# Patient Record
Sex: Male | Born: 1950 | Race: White | Hispanic: No | Marital: Married | Smoking: Never smoker
Health system: Southern US, Community
[De-identification: ages and names within clinical notes are randomized; demographics above are authoritative.]

## PROBLEM LIST (undated history)

## (undated) DIAGNOSIS — C801 Malignant (primary) neoplasm, unspecified: Secondary | ICD-10-CM

## (undated) DIAGNOSIS — E119 Type 2 diabetes mellitus without complications: Secondary | ICD-10-CM

## (undated) DIAGNOSIS — R569 Unspecified convulsions: Secondary | ICD-10-CM

## (undated) HISTORY — PX: BRAIN SURGERY: SHX531

## (undated) HISTORY — PX: CARDIAC SURGERY: SHX584

---

## 2014-04-19 ENCOUNTER — Inpatient Hospital Stay (HOSPITAL_COMMUNITY): Payer: Medicare Other

## 2014-04-19 ENCOUNTER — Encounter (HOSPITAL_COMMUNITY): Payer: Self-pay

## 2014-04-19 ENCOUNTER — Inpatient Hospital Stay (HOSPITAL_COMMUNITY)
Admission: EM | Admit: 2014-04-19 | Discharge: 2014-04-27 | DRG: 871 | Disposition: E | Payer: Medicare Other | Attending: Internal Medicine | Admitting: Internal Medicine

## 2014-04-19 ENCOUNTER — Inpatient Hospital Stay
Admission: EM | Admit: 2014-04-19 | Payer: Medicare Other | Source: Other Acute Inpatient Hospital | Admitting: Internal Medicine

## 2014-04-19 ENCOUNTER — Emergency Department (HOSPITAL_COMMUNITY): Payer: Medicare Other

## 2014-04-19 DIAGNOSIS — J15211 Pneumonia due to Methicillin susceptible Staphylococcus aureus: Secondary | ICD-10-CM | POA: Diagnosis present

## 2014-04-19 DIAGNOSIS — I5021 Acute systolic (congestive) heart failure: Secondary | ICD-10-CM | POA: Diagnosis present

## 2014-04-19 DIAGNOSIS — E1165 Type 2 diabetes mellitus with hyperglycemia: Secondary | ICD-10-CM | POA: Diagnosis present

## 2014-04-19 DIAGNOSIS — Z22322 Carrier or suspected carrier of Methicillin resistant Staphylococcus aureus: Secondary | ICD-10-CM | POA: Diagnosis not present

## 2014-04-19 DIAGNOSIS — I2510 Atherosclerotic heart disease of native coronary artery without angina pectoris: Secondary | ICD-10-CM | POA: Diagnosis present

## 2014-04-19 DIAGNOSIS — G8191 Hemiplegia, unspecified affecting right dominant side: Secondary | ICD-10-CM | POA: Diagnosis present

## 2014-04-19 DIAGNOSIS — E1129 Type 2 diabetes mellitus with other diabetic kidney complication: Secondary | ICD-10-CM | POA: Diagnosis present

## 2014-04-19 DIAGNOSIS — Z66 Do not resuscitate: Secondary | ICD-10-CM | POA: Diagnosis not present

## 2014-04-19 DIAGNOSIS — Z978 Presence of other specified devices: Secondary | ICD-10-CM | POA: Insufficient documentation

## 2014-04-19 DIAGNOSIS — G9340 Encephalopathy, unspecified: Secondary | ICD-10-CM | POA: Diagnosis present

## 2014-04-19 DIAGNOSIS — Z79899 Other long term (current) drug therapy: Secondary | ICD-10-CM | POA: Diagnosis not present

## 2014-04-19 DIAGNOSIS — Z515 Encounter for palliative care: Secondary | ICD-10-CM

## 2014-04-19 DIAGNOSIS — E43 Unspecified severe protein-calorie malnutrition: Secondary | ICD-10-CM | POA: Diagnosis present

## 2014-04-19 DIAGNOSIS — Z87891 Personal history of nicotine dependence: Secondary | ICD-10-CM

## 2014-04-19 DIAGNOSIS — Z794 Long term (current) use of insulin: Secondary | ICD-10-CM

## 2014-04-19 DIAGNOSIS — R791 Abnormal coagulation profile: Secondary | ICD-10-CM | POA: Diagnosis present

## 2014-04-19 DIAGNOSIS — R6520 Severe sepsis without septic shock: Secondary | ICD-10-CM | POA: Diagnosis present

## 2014-04-19 DIAGNOSIS — E785 Hyperlipidemia, unspecified: Secondary | ICD-10-CM | POA: Diagnosis present

## 2014-04-19 DIAGNOSIS — Z789 Other specified health status: Secondary | ICD-10-CM

## 2014-04-19 DIAGNOSIS — Z7902 Long term (current) use of antithrombotics/antiplatelets: Secondary | ICD-10-CM

## 2014-04-19 DIAGNOSIS — R4182 Altered mental status, unspecified: Secondary | ICD-10-CM

## 2014-04-19 DIAGNOSIS — A419 Sepsis, unspecified organism: Secondary | ICD-10-CM

## 2014-04-19 DIAGNOSIS — I63519 Cerebral infarction due to unspecified occlusion or stenosis of unspecified middle cerebral artery: Secondary | ICD-10-CM | POA: Diagnosis present

## 2014-04-19 DIAGNOSIS — E440 Moderate protein-calorie malnutrition: Secondary | ICD-10-CM

## 2014-04-19 DIAGNOSIS — A4101 Sepsis due to Methicillin susceptible Staphylococcus aureus: Secondary | ICD-10-CM | POA: Diagnosis present

## 2014-04-19 DIAGNOSIS — M6289 Other specified disorders of muscle: Secondary | ICD-10-CM

## 2014-04-19 DIAGNOSIS — Z7982 Long term (current) use of aspirin: Secondary | ICD-10-CM

## 2014-04-19 DIAGNOSIS — J13 Pneumonia due to Streptococcus pneumoniae: Secondary | ICD-10-CM | POA: Diagnosis present

## 2014-04-19 DIAGNOSIS — Z85841 Personal history of malignant neoplasm of brain: Secondary | ICD-10-CM

## 2014-04-19 DIAGNOSIS — D689 Coagulation defect, unspecified: Secondary | ICD-10-CM

## 2014-04-19 DIAGNOSIS — R40243 Glasgow coma scale score 3-8: Secondary | ICD-10-CM | POA: Diagnosis not present

## 2014-04-19 DIAGNOSIS — K8020 Calculus of gallbladder without cholecystitis without obstruction: Secondary | ICD-10-CM | POA: Diagnosis present

## 2014-04-19 DIAGNOSIS — J9601 Acute respiratory failure with hypoxia: Secondary | ICD-10-CM | POA: Diagnosis present

## 2014-04-19 DIAGNOSIS — Z931 Gastrostomy status: Secondary | ICD-10-CM

## 2014-04-19 DIAGNOSIS — R531 Weakness: Secondary | ICD-10-CM | POA: Insufficient documentation

## 2014-04-19 DIAGNOSIS — I10 Essential (primary) hypertension: Secondary | ICD-10-CM | POA: Diagnosis present

## 2014-04-19 DIAGNOSIS — Z89422 Acquired absence of other left toe(s): Secondary | ICD-10-CM | POA: Diagnosis not present

## 2014-04-19 DIAGNOSIS — R410 Disorientation, unspecified: Secondary | ICD-10-CM

## 2014-04-19 HISTORY — DX: Malignant (primary) neoplasm, unspecified: C80.1

## 2014-04-19 HISTORY — DX: Unspecified convulsions: R56.9

## 2014-04-19 HISTORY — DX: Type 2 diabetes mellitus without complications: E11.9

## 2014-04-19 LAB — MRSA PCR SCREENING: MRSA by PCR: POSITIVE — AB

## 2014-04-19 LAB — I-STAT ARTERIAL BLOOD GAS, ED
Acid-base deficit: 4 mmol/L — ABNORMAL HIGH (ref 0.0–2.0)
BICARBONATE: 19.6 meq/L — AB (ref 20.0–24.0)
O2 Saturation: 99 %
Patient temperature: 98.1
TCO2: 21 mmol/L (ref 0–100)
pCO2 arterial: 30.5 mmHg — ABNORMAL LOW (ref 35.0–45.0)
pH, Arterial: 7.415 (ref 7.350–7.450)
pO2, Arterial: 121 mmHg — ABNORMAL HIGH (ref 80.0–100.0)

## 2014-04-19 LAB — URINALYSIS, ROUTINE W REFLEX MICROSCOPIC
KETONES UR: 15 mg/dL — AB
LEUKOCYTES UA: NEGATIVE
Nitrite: NEGATIVE
Protein, ur: >300 mg/dL — AB
Urobilinogen, UA: 1 mg/dL (ref 0.0–1.0)
pH: 6 (ref 5.0–8.0)

## 2014-04-19 LAB — CORTISOL: Cortisol, Plasma: 86.8 ug/dL

## 2014-04-19 LAB — POCT I-STAT 3, ART BLOOD GAS (G3+)
ACID-BASE DEFICIT: 2 mmol/L (ref 0.0–2.0)
Bicarbonate: 21 mEq/L (ref 20.0–24.0)
O2 SAT: 95 %
PH ART: 7.47 — AB (ref 7.350–7.450)
PO2 ART: 65 mmHg — AB (ref 80.0–100.0)
TCO2: 22 mmol/L (ref 0–100)
pCO2 arterial: 28.6 mmHg — ABNORMAL LOW (ref 35.0–45.0)

## 2014-04-19 LAB — CBC WITH DIFFERENTIAL/PLATELET
Basophils Absolute: 0 10*3/uL (ref 0.0–0.1)
Basophils Relative: 0 % (ref 0–1)
Eosinophils Absolute: 0 10*3/uL (ref 0.0–0.7)
Eosinophils Relative: 0 % (ref 0–5)
HEMATOCRIT: 42.5 % (ref 39.0–52.0)
Hemoglobin: 15.3 g/dL (ref 13.0–17.0)
LYMPHS PCT: 3 % — AB (ref 12–46)
Lymphs Abs: 0.6 10*3/uL — ABNORMAL LOW (ref 0.7–4.0)
MCH: 32.4 pg (ref 26.0–34.0)
MCHC: 36 g/dL (ref 30.0–36.0)
MCV: 90 fL (ref 78.0–100.0)
MONOS PCT: 7 % (ref 3–12)
Monocytes Absolute: 1.3 10*3/uL — ABNORMAL HIGH (ref 0.1–1.0)
Neutro Abs: 16.7 10*3/uL — ABNORMAL HIGH (ref 1.7–7.7)
Neutrophils Relative %: 90 % — ABNORMAL HIGH (ref 43–77)
Platelets: 210 10*3/uL (ref 150–400)
RBC: 4.72 MIL/uL (ref 4.22–5.81)
RDW: 12.2 % (ref 11.5–15.5)
WBC: 18.5 10*3/uL — AB (ref 4.0–10.5)

## 2014-04-19 LAB — GLUCOSE, CAPILLARY
GLUCOSE-CAPILLARY: 186 mg/dL — AB (ref 70–99)
GLUCOSE-CAPILLARY: 117 mg/dL — AB (ref 70–99)
Glucose-Capillary: 381 mg/dL — ABNORMAL HIGH (ref 70–99)
Glucose-Capillary: 459 mg/dL — ABNORMAL HIGH (ref 70–99)
Glucose-Capillary: 485 mg/dL — ABNORMAL HIGH (ref 70–99)
Glucose-Capillary: 443 mg/dL — ABNORMAL HIGH (ref 70–99)
Glucose-Capillary: 325 mg/dL — ABNORMAL HIGH (ref 70–99)
Glucose-Capillary: 271 mg/dL — ABNORMAL HIGH (ref 70–99)
Glucose-Capillary: 229 mg/dL — ABNORMAL HIGH (ref 70–99)

## 2014-04-19 LAB — COMPREHENSIVE METABOLIC PANEL
ALT: 13 U/L (ref 0–53)
ANION GAP: 12 (ref 5–15)
AST: 16 U/L (ref 0–37)
Albumin: 2.8 g/dL — ABNORMAL LOW (ref 3.5–5.2)
Alkaline Phosphatase: 163 U/L — ABNORMAL HIGH (ref 39–117)
BUN: 17 mg/dL (ref 6–23)
CHLORIDE: 103 mmol/L (ref 96–112)
CO2: 20 mmol/L (ref 19–32)
Calcium: 8.9 mg/dL (ref 8.4–10.5)
Creatinine, Ser: 1.08 mg/dL (ref 0.50–1.35)
GFR calc Af Amer: 81 mL/min — ABNORMAL LOW (ref >90)
GFR, EST NON AFRICAN AMERICAN: 70 mL/min — AB (ref >90)
Glucose, Bld: 420 mg/dL — ABNORMAL HIGH (ref 70–99)
POTASSIUM: 4.5 mmol/L (ref 3.5–5.1)
SODIUM: 135 mmol/L (ref 135–145)
Total Bilirubin: 0.9 mg/dL (ref 0.3–1.2)
Total Protein: 7.3 g/dL (ref 6.0–8.3)

## 2014-04-19 LAB — TROPONIN I
TROPONIN I: 0.14 ng/mL — AB (ref <0.031)
Troponin I: 0.35 ng/mL — ABNORMAL HIGH (ref <0.031)
Troponin I: 0.07 ng/mL — ABNORMAL HIGH (ref <0.031)

## 2014-04-19 LAB — I-STAT CG4 LACTIC ACID, ED: LACTIC ACID, VENOUS: 2.77 mmol/L — AB (ref 0.5–2.0)

## 2014-04-19 LAB — URINE MICROSCOPIC-ADD ON

## 2014-04-19 LAB — PROTIME-INR
INR: 1.63 — AB (ref 0.00–1.49)
PROTHROMBIN TIME: 19.5 s — AB (ref 11.6–15.2)

## 2014-04-19 LAB — PHOSPHORUS: PHOSPHORUS: 1.3 mg/dL — AB (ref 2.3–4.6)

## 2014-04-19 LAB — BRAIN NATRIURETIC PEPTIDE: B NATRIURETIC PEPTIDE 5: 164.2 pg/mL — AB (ref 0.0–100.0)

## 2014-04-19 LAB — PHENYTOIN LEVEL, TOTAL: Phenytoin Lvl: 14.2 ug/mL (ref 10.0–20.0)

## 2014-04-19 LAB — CBG MONITORING, ED: Glucose-Capillary: 413 mg/dL — ABNORMAL HIGH (ref 70–99)

## 2014-04-19 LAB — ABO/RH: ABO/RH(D): O POS

## 2014-04-19 LAB — TYPE AND SCREEN
ABO/RH(D): O POS
Antibody Screen: NEGATIVE

## 2014-04-19 LAB — MAGNESIUM: Magnesium: 1.8 mg/dL (ref 1.5–2.5)

## 2014-04-19 LAB — LACTIC ACID, PLASMA: Lactic Acid, Venous: 2 mmol/L (ref 0.5–2.0)

## 2014-04-19 LAB — APTT: APTT: 33 s (ref 24–37)

## 2014-04-19 LAB — FIBRINOGEN: Fibrinogen: >800 mg/dL — ABNORMAL HIGH (ref 204–475)

## 2014-04-19 MED ORDER — VITAL HIGH PROTEIN PO LIQD
1000.0000 mL | ORAL | Status: DC
  Filled 2014-04-19 (×2): qty 1000

## 2014-04-19 MED ORDER — PROPOFOL 10 MG/ML IV EMUL
INTRAVENOUS | Status: AC
  Administered 2014-04-19: 04:00:00
  Filled 2014-04-19: qty 100

## 2014-04-19 MED ORDER — DEXTROSE 5 % IV SOLN
2.0000 g | Freq: Three times a day (TID) | INTRAVENOUS | Status: DC
  Administered 2014-04-19 – 2014-04-20 (×4): 2 g via INTRAVENOUS
  Filled 2014-04-19 (×7): qty 2

## 2014-04-19 MED ORDER — INSULIN ASPART 100 UNIT/ML ~~LOC~~ SOLN: 0.0000 [IU] | Freq: Every day | SUBCUTANEOUS | Status: DC

## 2014-04-19 MED ORDER — CHLORHEXIDINE GLUCONATE 0.12 % MT SOLN
15.0000 mL | Freq: Two times a day (BID) | OROMUCOSAL | Status: DC
  Administered 2014-04-19 – 2014-04-21 (×5): 15 mL via OROMUCOSAL
  Filled 2014-04-19 (×5): qty 15

## 2014-04-19 MED ORDER — PHENYTOIN 125 MG/5ML PO SUSP
300.0000 mg | Freq: Every day | ORAL | Status: DC
  Filled 2014-04-19: qty 12

## 2014-04-19 MED ORDER — ASPIRIN EC 81 MG PO TBEC
81.0000 mg | DELAYED_RELEASE_TABLET | Freq: Every day | ORAL | Status: DC
  Filled 2014-04-19: qty 1

## 2014-04-19 MED ORDER — SODIUM CHLORIDE 0.9 % IV SOLN
INTRAVENOUS | Status: DC
  Administered 2014-04-19: 3.8 [IU]/h via INTRAVENOUS
  Administered 2014-04-21: 14 [IU]/h via INTRAVENOUS
  Filled 2014-04-19 (×2): qty 2.5

## 2014-04-19 MED ORDER — FENTANYL CITRATE 0.05 MG/ML IJ SOLN
50.0000 ug | INTRAMUSCULAR | Status: AC | PRN
Start: 2014-04-19 — End: 2014-04-19
  Administered 2014-04-19 (×3): 50 ug via INTRAVENOUS
  Filled 2014-04-19 (×2): qty 2

## 2014-04-19 MED ORDER — ETOMIDATE 2 MG/ML IV SOLN
20.0000 mg | Freq: Once | INTRAVENOUS | Status: AC
  Administered 2014-04-19: 20 mg via INTRAVENOUS

## 2014-04-19 MED ORDER — PANTOPRAZOLE SODIUM 40 MG IV SOLR
40.0000 mg | INTRAVENOUS | Status: DC
  Administered 2014-04-19 – 2014-04-21 (×3): 40 mg via INTRAVENOUS
  Filled 2014-04-19 (×4): qty 40

## 2014-04-19 MED ORDER — SODIUM CHLORIDE 0.9 % IV BOLUS (SEPSIS)
1000.0000 mL | Freq: Once | INTRAVENOUS | Status: AC
  Administered 2014-04-19: 1000 mL via INTRAVENOUS

## 2014-04-19 MED ORDER — HEPARIN SODIUM (PORCINE) 5000 UNIT/ML IJ SOLN
5000.0000 [IU] | Freq: Three times a day (TID) | INTRAMUSCULAR | Status: DC
  Administered 2014-04-19 – 2014-04-21 (×7): 5000 [IU] via SUBCUTANEOUS
  Filled 2014-04-19 (×6): qty 1

## 2014-04-19 MED ORDER — SUCCINYLCHOLINE CHLORIDE 20 MG/ML IJ SOLN
100.0000 mg | Freq: Once | INTRAMUSCULAR | Status: AC
  Administered 2014-04-19: 100 mg via INTRAVENOUS

## 2014-04-19 MED ORDER — GABAPENTIN 400 MG PO CAPS
800.0000 mg | ORAL_CAPSULE | Freq: Three times a day (TID) | ORAL | Status: DC
  Administered 2014-04-19 (×3): 800 mg via ORAL
  Filled 2014-04-19 (×6): qty 2

## 2014-04-19 MED ORDER — MUPIROCIN 2 % EX OINT
1.0000 "application " | TOPICAL_OINTMENT | Freq: Two times a day (BID) | CUTANEOUS | Status: DC
  Administered 2014-04-19 – 2014-04-21 (×5): 1 via NASAL
  Filled 2014-04-19: qty 22

## 2014-04-19 MED ORDER — ASPIRIN 81 MG PO CHEW
81.0000 mg | CHEWABLE_TABLET | Freq: Every day | ORAL | Status: DC
  Administered 2014-04-19 – 2014-04-21 (×3): 81 mg via ORAL
  Filled 2014-04-19 (×3): qty 1

## 2014-04-19 MED ORDER — POTASSIUM & SODIUM PHOSPHATES 280-160-250 MG PO PACK
2.0000 | PACK | Freq: Three times a day (TID) | ORAL | Status: AC
  Administered 2014-04-19 – 2014-04-20 (×3): 2
  Filled 2014-04-19 (×3): qty 2

## 2014-04-19 MED ORDER — DOCUSATE SODIUM 50 MG/5ML PO LIQD
100.0000 mg | Freq: Two times a day (BID) | ORAL | Status: DC | PRN
  Filled 2014-04-19: qty 10

## 2014-04-19 MED ORDER — GABAPENTIN 800 MG PO TABS
800.0000 mg | ORAL_TABLET | Freq: Three times a day (TID) | ORAL | Status: DC
  Filled 2014-04-19 (×3): qty 1

## 2014-04-19 MED ORDER — PRO-STAT SUGAR FREE PO LIQD
30.0000 mL | Freq: Two times a day (BID) | ORAL | Status: AC
  Administered 2014-04-19 (×2): 30 mL
  Filled 2014-04-19 (×2): qty 30

## 2014-04-19 MED ORDER — FENTANYL CITRATE 0.05 MG/ML IJ SOLN
50.0000 ug | INTRAMUSCULAR | Status: DC | PRN
  Administered 2014-04-19 – 2014-04-21 (×4): 50 ug via INTRAVENOUS
  Filled 2014-04-19 (×5): qty 2

## 2014-04-19 MED ORDER — VITAL AF 1.2 CAL PO LIQD
1000.0000 mL | ORAL | Status: DC
  Administered 2014-04-19 – 2014-04-21 (×3): 1000 mL
  Filled 2014-04-19 (×8): qty 1000

## 2014-04-19 MED ORDER — VANCOMYCIN HCL IN DEXTROSE 1-5 GM/200ML-% IV SOLN
1000.0000 mg | Freq: Two times a day (BID) | INTRAVENOUS | Status: DC
  Administered 2014-04-19 – 2014-04-20 (×4): 1000 mg via INTRAVENOUS
  Filled 2014-04-19 (×7): qty 200

## 2014-04-19 MED ORDER — PHENYTOIN 125 MG/5ML PO SUSP
200.0000 mg | Freq: Every day | ORAL | Status: DC
  Administered 2014-04-19 – 2014-04-20 (×2): 200 mg
  Filled 2014-04-19 (×3): qty 8

## 2014-04-19 MED ORDER — SODIUM CHLORIDE 0.9 % IV SOLN
250.0000 mL | INTRAVENOUS | Status: DC | PRN
  Administered 2014-04-21: 250 mL via INTRAVENOUS

## 2014-04-19 MED ORDER — CETYLPYRIDINIUM CHLORIDE 0.05 % MT LIQD
7.0000 mL | Freq: Four times a day (QID) | OROMUCOSAL | Status: DC
  Administered 2014-04-19 – 2014-04-21 (×9): 7 mL via OROMUCOSAL

## 2014-04-19 MED ORDER — CLOPIDOGREL BISULFATE 75 MG PO TABS
75.0000 mg | ORAL_TABLET | Freq: Every day | ORAL | Status: DC
  Administered 2014-04-19 – 2014-04-21 (×3): 75 mg via ORAL
  Filled 2014-04-19 (×3): qty 1

## 2014-04-19 MED ORDER — PHENYTOIN 125 MG/5ML PO SUSP
100.0000 mg | Freq: Every morning | ORAL | Status: DC
  Administered 2014-04-19 – 2014-04-21 (×3): 100 mg
  Filled 2014-04-19 (×3): qty 4

## 2014-04-19 MED ORDER — DEXTROSE 5 % IV SOLN
500.0000 mg | INTRAVENOUS | Status: DC
  Administered 2014-04-19 – 2014-04-20 (×2): 500 mg via INTRAVENOUS
  Filled 2014-04-19 (×2): qty 500

## 2014-04-19 MED ORDER — LEVETIRACETAM 100 MG/ML PO SOLN
1000.0000 mg | Freq: Two times a day (BID) | ORAL | Status: DC
Start: 2014-04-19 — End: 2014-04-21
  Administered 2014-04-19 – 2014-04-21 (×5): 1000 mg
  Filled 2014-04-19 (×6): qty 10

## 2014-04-19 MED ORDER — INSULIN ASPART 100 UNIT/ML ~~LOC~~ SOLN
0.0000 [IU] | SUBCUTANEOUS | Status: DC
  Administered 2014-04-19: 15 [IU] via SUBCUTANEOUS

## 2014-04-19 MED ORDER — INSULIN ASPART 100 UNIT/ML ~~LOC~~ SOLN: 0.0000 [IU] | Freq: Three times a day (TID) | SUBCUTANEOUS | Status: DC

## 2014-04-19 MED ORDER — CEFEPIME HCL 2 G IJ SOLR: 2.0000 g | Freq: Once | INTRAMUSCULAR | Status: DC

## 2014-04-19 MED ORDER — VANCOMYCIN HCL IN DEXTROSE 1-5 GM/200ML-% IV SOLN
1000.0000 mg | Freq: Once | INTRAVENOUS | Status: DC
Start: 2014-04-19 — End: 2014-04-19
  Administered 2014-04-19: 1000 mg via INTRAVENOUS

## 2014-04-19 MED ORDER — CHLORHEXIDINE GLUCONATE CLOTH 2 % EX PADS
6.0000 | MEDICATED_PAD | Freq: Every day | CUTANEOUS | Status: DC
  Administered 2014-04-20 – 2014-04-22 (×3): 6 via TOPICAL

## 2014-04-19 NOTE — Progress Notes (Addendum)
PULMONARY / CRITICAL CARE MEDICINE HISTORY AND PHYSICAL EXAMINATION   Name: Dylan Rogers MRN: [id number] DOB: 03/17/1950    ADMISSION DATE:  04/14/2014  PRIMARY SERVICE: PCCM  CHIEF COMPLAINT:  Altered Mental status  BRIEF PATIENT DESCRIPTION: 64 y/o man with recent craniotomy for tx of astrocytoma who presents w/ fever, tachypnea, and AMS  SIGNIFICANT EVENTS / STUDIES:  Intubated in ED for respiratory distress  LINES / TUBES: ETT PIV x2 OGT Foley  CULTURES: Blood and urine cx obtained from OSH [facility])  ANTIBIOTICS: Cefepime Vanc Azithro  SUBJECTIVE:  Patient not alert Tmax of 101.7  VITAL SIGNS: Temp:  [98.1 F (36.7 C)-101.7 F (38.7 C)] 98.6 F (37 C) (02/22 0615) Pulse Rate:  [121-141] 123 (02/22 0645) Resp:  [21-35] 22 (02/22 0645) BP: (98-168)/(58-83) 148/80 mmHg (02/22 0645) SpO2:  [93 %-100 %] 99 % (02/22 0645) FiO2 (%):  [40 %] 40 % (02/22 0615) Weight:  [160 lb 15 oz (73 kg)] 160 lb 15 oz (73 kg) (02/22 0423) HEMODYNAMICS:   VENTILATOR SETTINGS: Vent Mode:  [-] PRVC FiO2 (%):  [40 %] 40 % Set Rate:  [18 bmp-22 bmp] 22 bmp Vt Set:  [500 mL-650 mL] 500 mL PEEP:  [5 cmH20] 5 cmH20 Plateau Pressure:  [11 cmH20-12 cmH20] 12 cmH20 INTAKE / OUTPUT: Intake/Output    None     PHYSICAL EXAMINATION: General:  Chronically ill appearing, intubated Neuro:  Non-sedated but grimaces to painful stimulation of right side with some withdrawal. Does not appear to respond to left side HEENT:  Evidence of prior craniotomy. Neck: No JVD Cardiovascular:  Tachycardic, no M/R/G Lungs:  Clear to auscultation on anterior auscultation Abdomen:  Soft Musculoskeletal:  S/p left transmetatarsal amputation Skin:  Intact  LABS:  CBC  Recent Labs Lab 04/11/2014 0421  WBC 18.5*  HGB 15.3  HCT 42.5  PLT 210   Coag's  Recent Labs Lab 04/22/2014 0425  APTT 33  INR 1.63*   BMET  Recent Labs Lab 04/13/2014 0421  NA 135  K 4.5  CL 103  CO2 20  BUN 17   CREATININE 1.08  GLUCOSE 420*   Electrolytes  Recent Labs Lab 04/12/2014 0421  CALCIUM 8.9   Sepsis Markers  Recent Labs Lab 04/21/2014 0430  LATICACIDVEN 2.77*   ABG  Recent Labs Lab 04/16/2014 0505  PHART 7.415  PCO2ART 30.5*  PO2ART 121.0*   Liver Enzymes  Recent Labs Lab 04/18/2014 0421  AST 16  ALT 13  ALKPHOS 163*  BILITOT 0.9  ALBUMIN 2.8*   Cardiac Enzymes  Recent Labs Lab 03/29/2014 0425  TROPONINI 0.35*   Glucose  Recent Labs Lab 04/13/2014 0413 04/20/2014 0620  GLUCAP 413* 381*    Imaging Dg Chest Portable 1 View  04/05/2014   CLINICAL DATA:  Respiratory distress, code sepsis, intubated.  EXAM: PORTABLE CHEST - 1 VIEW  COMPARISON:  None.  FINDINGS: Endotracheal tube is 10 cm from the carina at the thoracic inlet. Enteric tube is in place, tip below the diaphragm, not included in the field of view. Patient is post median sternotomy. The heart size and mediastinal contours are normal. Pulmonary vasculature is normal. There is ill-defined opacity in the right lung base. No large pleural effusion or pneumothorax. No acute osseous abnormality is seen.  IMPRESSION: 1. Endotracheal tube 10 cm from the carina at the thoracic inlet. Enteric tube in place, tip not included in the field of view. 2. Ill-defined opacity at the right lung base, likely atelectasis. Pneumonia could have a  similar appearance.   Electronically Signed   By: Jeb Levering M.D.   On: 04/09/2014 04:55   Dg Abd Portable 1v  04/12/2014   CLINICAL DATA:  Nasogastric catheter repositioning  EXAM: PORTABLE ABDOMEN - 1 VIEW  COMPARISON:  Film from earlier in the same day  FINDINGS: The nasogastric catheter is been withdrawn slightly and on coiled within the stomach. It lies in the proximal portion of the stomach. Scattered large and small bowel gas is noted without obstructive pattern. No other focal abnormality is seen.  IMPRESSION: Nasogastric catheter within the stomach proximally. The previously  seen loop has been reduced.   Electronically Signed   By: Inez Catalina M.D.   On: 03/30/2014 08:30   Dg Abd Portable 1v  03/29/2014   CLINICAL DATA:  NG tube placement.  EXAM: PORTABLE ABDOMEN - 1 VIEW  COMPARISON:  Chest radiograph -earlier same day  FINDINGS: Enteric tube tip and side port projects over the gastric fundus.  Paucity of bowel gas without definite evidence of obstruction.  No supine evidence of pneumoperitoneum, though note, the right upper abdominal quadrant is excluded from view.  Limited visualization of lower thorax demonstrates sequela of prior median sternotomy and potential minimal right basilar opacities, incompletely evaluated.  No acute osseus abnormalities.  IMPRESSION: Enteric tube tip and side port projects over the gastric fundus.   Electronically Signed   By: Sandi Mariscal M.D.   On: 04/08/2014 08:13    EKG: Sinus tachycardia CXR: RLL infiltrate consistent with PNA, tube high ( will adjust down)  ASSESSMENT / PLAN:  Active Problems:   Sepsis   PULMONARY A: Hypoxia Respiratory Failure requiring mechanical ventilation Pneumonia P:   Unsure why on 6 cc/kg, as rate 22, can adjust UP tv to 7 cc/kg and rate down, repeat abg Keep same MV Wean with no intention to extubate today pcxr in am for worsening infiltrate rt base? Aspiration more likley than CAP  CARDIOVASCULAR A: Sinus tachycarida Hx of CAD on plavix P:   Unclear if patient has had PCI requiring stents and when these were placed. Continue plavix for now. Tele Pos balance , la repeated  RENAL A: Severe sepsis P:   Pos balance LA repeat ensure got 30 cc/kg bolus on admission Chem in am   GASTROINTESTINAL A: Need for enteral feeds Probable malnutrition P:   Thin habitus and low albumin suggestive of poor nutritional status. Will consult RD for tube feed recs Start TF, pepup  HEMATOLOGIC A: Apparent need for antiplatelet therapy Leukocytosis coagulapthy ( cause unknown) - sepsis,  liver>>? P:   Repeat coags in am  scd Treat sepsis If bleeding then reverse LF repeat in am   INFECTIOUS A: Severe sepsis due to Pneumonia Reminds me of aspiration P:   2/22 cefepime>>> 2/22 azithro>>> 2/22 vanc>>>  BC 2/22>>> Sputum 2/22>>> In am narrow  ENDOCRINE A: DM, insulin dependent P: Will manage with ICU insulin protocol Hold metformin  NEUROLOGIC A: Encephalopathy ?Seizure disorder R/o new CVA (weakness), r/o astrocytoma progression P:   Appreciate neuro recs; favor acute process driven by sepsis, in the setting of poor reserve with hx of astrocytoma and prior surgery. Keppra 1000mg  BID MRI eeg If MRI neg, consider LP  TODAY'S SUMMARY: 64 y/o man with sepsis and altered mental status with respiratory failure requiring mechanical ventilation.  Cordelia Poche, MD PGY-2, [facility] 04/15/2014, 7:06 AM   STAFF NOTE: Linwood Dibbles, MD FACP have personally reviewed patient's available data, including  medical history, events of note, physical examination and test results as part of my evaluation. I have discussed with resident/NP and other care providers such as pharmacist, RN and RRT. In addition, I personally evaluated patient and elicited key findings of: poor neuro assessment left weakness, for eeg, MRI, wean, adjust TV up, rate down to keep same MV, ABX for now, likley to narrow in am, start TF pepup  The patient is critically ill with multiple organ systems failure and requires high complexity decision making for assessment and support, frequent evaluation and titration of therapies, application of advanced monitoring technologies and extensive interpretation of multiple databases.   Critical Care Time devoted to patient care services described in this note is40 Minutes. This time reflects time of care of this signee: Merrie Roof, MD FACP. This critical care time does not reflect procedure time, or teaching time or supervisory  time of PA/NP/Med student/Med Resident etc but could involve care discussion time. Rest per NP/medical resident whose note is outlined above and that I agree with   Lavon Paganini. Titus Mould, MD, [place] Pgr: [facility] 04/03/2014 11:51 AM

## 2014-04-19 NOTE — ED Notes (Signed)
MD at bedside. 

## 2014-04-19 NOTE — ED Notes (Signed)
Pt is a transfer in from [place] for Sepsis, was accepted by [facility] and decompensated enroute, need for intubation on arrival. enroute pt had resp of 47 and shallow, improved with BVM, hx of astrocytoma. GCS of 8 reported by ems that transported pt. abx started at [facility].

## 2014-04-19 NOTE — Progress Notes (Signed)
Subjective: Patient is breathing over the ventilator, will not open eyes to command but will wince to pain and localize to sternal rub with right hand.   Objective: Current vital signs: BP 159/83 mmHg  Pulse 119  Temp(Src) 98.5 F (36.9 C) (Oral)  Resp 27  Ht 6\' 2"  (1.88 m)  Wt 73 kg (160 lb 15 oz)  BMI 20.65 kg/m2  SpO2 96% Vital signs in last 24 hours: Temp:  [98.1 F (36.7 C)-101.7 F (38.7 C)] 98.5 F (36.9 C) (02/22 0731) Pulse Rate:  [119-141] 119 (02/22 0715) Resp:  [21-35] 27 (02/22 0715) BP: (98-168)/(58-83) 159/83 mmHg (02/22 0715) SpO2:  [93 %-100 %] 96 % (02/22 0715) FiO2 (%):  [40 %] 40 % (02/22 0708) Weight:  [73 kg (160 lb 15 oz)] 73 kg (160 lb 15 oz) (02/22 0423)  Intake/Output from previous day:   Intake/Output this shift: Total I/O In: -  Out: 125 [Urine:125] Nutritional status:    Neurologic Exam:  Mental Status: Laying in bed with eyes closed. Will not follow commands or open eyes to voice.  When attempting to open eyes he will close them shut purposefully and when held open look at my face.  Winces to pain and localizes with right hand.  Cranial Nerves: II: blinks to threat bilaterally  pupils equal, round, reactive to light and accommodation III,IV, VI: oculocephalic reflex intact,  V,VII: face symmetric, able to close eyelids tightly VIII: will not follow verbal commands IX,X: gag reflex present XI: right shoulder shrug> left to pain XII: midline tongue   Motor: Right : Upper extremity   4/5    Left:     Upper extremity   0/5  Lower extremity   3/5     Lower extremity   1/5 --raises right leg off the bed to pain, muscle contraction of left leg to pain.  No movement of left arm noted Tone and bulk:normal tone throughout; no atrophy noted Sensory: withdraws to pain both right arm and leg,  Mild with drawl to pain in the left leg.  No reaction to pain in the left arm.  Deep Tendon Reflexes:  Right: Upper Extremity   Left: Upper extremity    biceps (C-5 to C-6) 2/4   biceps (C-5 to C-6) 2/4 tricep (C7) 2/4    triceps (C7) 2/4 Brachioradialis (C6) 2/4  Brachioradialis (C6) 2/4  Lower Extremity Lower Extremity  quadriceps (L-2 to L-4) 1/4   quadriceps (L-2 to L-4) 1/4 Achilles (S1) 0/4   Achilles (S1) 0/4  Plantars: Right: mute   Left: amputated toes Cerebellar: Unable to assess    Lab Results: Basic Metabolic Panel:  Recent Labs Lab 04/24/2014 0421  NA 135  K 4.5  CL 103  CO2 20  GLUCOSE 420*  BUN 17  CREATININE 1.08  CALCIUM 8.9    Liver Function Tests:  Recent Labs Lab 04/08/2014 0421  AST 16  ALT 13  ALKPHOS 163*  BILITOT 0.9  PROT 7.3  ALBUMIN 2.8*   No results for input(s): LIPASE, AMYLASE in the last 168 hours. No results for input(s): AMMONIA in the last 168 hours.  CBC:  Recent Labs Lab 04/08/2014 0421  WBC 18.5*  NEUTROABS 16.7*  HGB 15.3  HCT 42.5  MCV 90.0  PLT 210    Cardiac Enzymes:  Recent Labs Lab 04/09/2014 0425  TROPONINI 0.35*    Lipid Panel: No results for input(s): CHOL, TRIG, HDL, CHOLHDL, VLDL, LDLCALC in the last 168 hours.  CBG:  Recent  Labs Lab 04/22/2014 0413 04/16/2014 0620 04/06/2014 0728  GLUCAP 413* 381* 23*    Microbiology: Results for orders placed or performed during the hospital encounter of 04/24/2014  MRSA PCR Screening     Status: Abnormal   Collection Time: 04/01/2014  6:11 AM  Result Value Ref Range Status   MRSA by PCR POSITIVE (A) NEGATIVE Final    Comment:        The GeneXpert MRSA Assay (FDA approved for NASAL specimens only), is one component of a comprehensive MRSA colonization surveillance program. It is not intended to diagnose MRSA infection nor to guide or monitor treatment for MRSA infections. RESULT CALLED TO, READ BACK BY AND VERIFIED WITH: WRIGHT,M RN @ [phone] 04/08/2014 LEONARD,A     Coagulation Studies:  Recent Labs  04/24/2014 0425  LABPROT 19.5*  INR 1.63*    Imaging: Dg Chest Portable 1 View  04/23/2014    CLINICAL DATA:  Respiratory distress, code sepsis, intubated.  EXAM: PORTABLE CHEST - 1 VIEW  COMPARISON:  None.  FINDINGS: Endotracheal tube is 10 cm from the carina at the thoracic inlet. Enteric tube is in place, tip below the diaphragm, not included in the field of view. Patient is post median sternotomy. The heart size and mediastinal contours are normal. Pulmonary vasculature is normal. There is ill-defined opacity in the right lung base. No large pleural effusion or pneumothorax. No acute osseous abnormality is seen.  IMPRESSION: 1. Endotracheal tube 10 cm from the carina at the thoracic inlet. Enteric tube in place, tip not included in the field of view. 2. Ill-defined opacity at the right lung base, likely atelectasis. Pneumonia could have a similar appearance.   Electronically Signed   By: Jeb Levering M.D.   On: 04/03/2014 04:55   Dg Abd Portable 1v  04/08/2014   CLINICAL DATA:  Nasogastric catheter repositioning  EXAM: PORTABLE ABDOMEN - 1 VIEW  COMPARISON:  Film from earlier in the same day  FINDINGS: The nasogastric catheter is been withdrawn slightly and on coiled within the stomach. It lies in the proximal portion of the stomach. Scattered large and small bowel gas is noted without obstructive pattern. No other focal abnormality is seen.  IMPRESSION: Nasogastric catheter within the stomach proximally. The previously seen loop has been reduced.   Electronically Signed   By: Inez Catalina M.D.   On: 04/05/2014 08:30   Dg Abd Portable 1v  04/23/2014   CLINICAL DATA:  NG tube placement.  EXAM: PORTABLE ABDOMEN - 1 VIEW  COMPARISON:  Chest radiograph -earlier same day  FINDINGS: Enteric tube tip and side port projects over the gastric fundus.  Paucity of bowel gas without definite evidence of obstruction.  No supine evidence of pneumoperitoneum, though note, the right upper abdominal quadrant is excluded from view.  Limited visualization of lower thorax demonstrates sequela of prior median  sternotomy and potential minimal right basilar opacities, incompletely evaluated.  No acute osseus abnormalities.  IMPRESSION: Enteric tube tip and side port projects over the gastric fundus.   Electronically Signed   By: Sandi Mariscal M.D.   On: 04/15/2014 08:13    Medications:  Prior to Admission:  Prescriptions prior to admission  Medication Sig Dispense Refill Last Dose  . ALPRAZolam (XANAX) 0.5 MG tablet Take 0.5 mg by mouth at bedtime as needed for anxiety.     Marland Kitchen aspirin 81 MG tablet Take 81 mg by mouth daily.     Marland Kitchen atorvastatin (LIPITOR) 20 MG tablet Take 20 mg by  mouth daily.     . clopidogrel (PLAVIX) 75 MG tablet Take 75 mg by mouth daily.     . enalapril (VASOTEC) 5 MG tablet Take 5 mg by mouth daily.     Marland Kitchen gabapentin (NEURONTIN) 800 MG tablet Take 800 mg by mouth 3 (three) times daily.     Marland Kitchen glipiZIDE (GLUCOTROL XL) 5 MG 24 hr tablet Take 5 mg by mouth daily with breakfast.     . insulin aspart (NOVOLOG) 100 UNIT/ML injection Inject into the skin 3 (three) times daily before meals. Solution 150-200=0 units 201-250=2 units 251-300=4 units 301-350=6 units 351-400=8 units Greater than 400= 10 units and notify doctor 3 mL     . levETIRAcetam (KEPPRA) 1000 MG tablet Take 1,000 mg by mouth 2 (two) times daily.     Marland Kitchen LORazepam (ATIVAN) 0.5 MG tablet Take 0.5 mg by mouth daily as needed for anxiety.     . magnesium oxide (MAG-OX) 400 MG tablet Take 400 mg by mouth 2 (two) times daily.     . metFORMIN (GLUCOPHAGE) 850 MG tablet Take 850 mg by mouth 2 (two) times daily with a meal.     . metoprolol tartrate (LOPRESSOR) 25 MG tablet Take 25 mg by mouth daily.     Marland Kitchen oxyCODONE-acetaminophen (PERCOCET) 10-325 MG per tablet Take 1 tablet by mouth every 4 (four) hours as needed for pain.     . phenytoin (DILANTIN) 100 MG ER capsule Take by mouth 2 (two) times daily. Takes 100mg  in the AM and 300 mg in the PM     . saccharomyces boulardii (FLORASTOR) 250 MG capsule Take 250 mg by mouth 2 (two) times  daily.      Scheduled: . antiseptic oral rinse  7 mL Mouth Rinse QID  . aspirin  81 mg Oral Daily  . azithromycin  500 mg Intravenous Q24H  . ceFEPime (MAXIPIME) IV  2 g Intravenous 3 times per day  . chlorhexidine  15 mL Mouth Rinse BID  . clopidogrel  75 mg Oral Daily  . gabapentin  800 mg Oral TID  . heparin  5,000 Units Subcutaneous 3 times per day  . levETIRAcetam  1,000 mg Per Tube BID  . pantoprazole (PROTONIX) IV  40 mg Intravenous Q24H  . phenytoin  100 mg Per Tube q morning - 10a  . phenytoin  300 mg Per Tube Q2200  . vancomycin  1,000 mg Intravenous Q12H    Etta Quill PA-C [facility] [phone]  04/14/2014, 9:32 AM   Patient seen and examined.  Clinical course and management discussed.  Necessary edits performed.  I agree with the above.  Assessment and plan of care developed and discussed below.   Assessment/Plan: 64 YO male with AMS in the setting of possible pneumonia and sepsis.  Patient has had previous right craniotomy for resection of right temporal brain tumor.  Unclear how weak patient was at baseline on the left. Possible worsening left sided weakness in the setting of infection, however will obtain MRI to further evaluate. No seizures while in hospital and current corrected Dilantin is 21.2.  Recommendations: 1.  Will follow up MRI results 2.  Would continue Keppra at current dose.  Will decrease pm Dilantin to 200mg . 3.  Dilantin level in AM.     Alexis Goodell, MD [facility] [phone]  04/14/2014  12:26 PM

## 2014-04-19 NOTE — ED Notes (Signed)
Lab at bedside

## 2014-04-19 NOTE — Progress Notes (Signed)
EEG completed; results pending.    

## 2014-04-19 NOTE — Procedures (Signed)
ELECTROENCEPHALOGRAM REPORT   Patient: Dylan Rogers       Room #: 61M-03 EEG No. ID:  Age: 64 y.o.        Sex: male Referring Physician: Dr Titus Mould Report Date:  03/30/2014        Interpreting Physician: Jim Like, Larkin Ina  History: Dylan Rogers is an 64 y.o. 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   Medications:  Scheduled: . antiseptic oral rinse  7 mL Mouth Rinse QID  . aspirin  81 mg Oral Daily  . azithromycin  500 mg Intravenous Q24H  . ceFEPime (MAXIPIME) IV  2 g Intravenous 3 times per day  . chlorhexidine  15 mL Mouth Rinse BID  . clopidogrel  75 mg Oral Daily  . feeding supplement (PRO-STAT SUGAR FREE 64)  30 mL Per Tube BID  . feeding supplement (VITAL HIGH PROTEIN)  1,000 mL Per Tube Q24H  . gabapentin  800 mg Oral TID  . heparin  5,000 Units Subcutaneous 3 times per day  . levETIRAcetam  1,000 mg Per Tube BID  . pantoprazole (PROTONIX) IV  40 mg Intravenous Q24H  . phenytoin  100 mg Per Tube q morning - 10a  . phenytoin  300 mg Per Tube Q2200  . vancomycin  1,000 mg Intravenous Q12H    Conditions of Recording:  This is a 16 channel EEG carried out with the patient in the unresponsive state.  Description:  The waking background activity consists of a low voltage, poorly formed theta and delta activity. No dominant posterior rhythm is noted. There is focal slowing noted in the right temporal and occipital region. Intermittent sharp wave activity is seen in the right temporal region. At times this appears to become more rhythmic but no clear sustained evolution is noted.  Hyperventilation and photic stimulationwas not performed.     IMPRESSION: Abnormal EEG due to generalized slowing indicating a mild to moderate cerebral disturbance (encephalopathy). Focal slowing and intermittent sharp wave activity is noted in the right temporal region indicating a potential seizure foci.    Jim Like,  DO [facility] [phone]  If 7pm- 7am, please page neurology on call as listed in [place]. 04/12/2014, 11:54 AM

## 2014-04-19 NOTE — Progress Notes (Signed)
Change per md

## 2014-04-19 NOTE — Progress Notes (Signed)
INITIAL NUTRITION ASSESSMENT  DOCUMENTATION CODES Per approved criteria  -Non-severe (moderate) malnutrition in the context of chronic illness or injury   Pt meets criteria for moderate MALNUTRITION in the context of chronic illness as evidenced by mild-moderate depletion of muscle and subcutaneous fat mass.  INTERVENTION:  Initiate TF via OGT with Vital AF 1.2 at 25 ml/h and Prostat 30 ml BID on day 1; on day 2, d/c Prostat and increase to goal rate of 70 ml/h (1680 ml per day) to provide 2016 kcals, 126 gm protein, 1362 ml free water daily.  NUTRITION DIAGNOSIS: Inadequate oral intake related to inability to eat as evidenced by NPO status.   Goal: Intake to meet >90% of estimated nutrition needs.  Monitor:  TF tolerance/adequacy, weight trend, labs, vent status.  Reason for Assessment: MD Consult for TF initiation and management.  64 y.o. male  Admitting Dx: Altered mental status  ASSESSMENT: Patient presented with fever, tachypnea, and AMS on 2/22. Recent craniotomy for treatment of astrocytoma.  Discussed patient in ICU rounds and with RN today. Received MD Consult for TF initiation and management.  Nutrition Focused Physical Exam:  Subcutaneous Fat:  Orbital Region: NA Upper Arm Region: mild depletion Thoracic and Lumbar Region: NA  Muscle:  Temple Region: WNL Clavicle Bone Region: mild depletion Clavicle and Acromion Bone Region: mild depletion Scapular Bone Region: NA Dorsal Hand: NA Patellar Region: moderate depletion Anterior Thigh Region: moderate depletion Posterior Calf Region: moderate depletion  Edema: none  Patient is currently intubated on ventilator support MV: 10.7 L/min Temp (24hrs), Avg:99.2 F (37.3 C), Min:98.1 F (36.7 C), Max:101.7 F (38.7 C)  Propofol: none   Height: Ht Readings from Last 1 Encounters:  04/18/2014 6\' 2"  (1.88 m)    Weight: Wt Readings from Last 1 Encounters:  04/24/2014 160 lb 15 oz (73 kg)    Ideal Body  Weight: 86.4 kg  % Ideal Body Weight: 84%  Wt Readings from Last 10 Encounters:  04/05/2014 160 lb 15 oz (73 kg)    Usual Body Weight: unknown  % Usual Body Weight: N/A  BMI:  Body mass index is 20.65 kg/(m^2).  Estimated Nutritional Needs: Kcal: 2109 Protein: 110-130 gm Fluid: 2.1 L  Skin: 3 stage 2 pressure ulcers to sacrum  Diet Order:  NPO  EDUCATION NEEDS: -Education not appropriate at this time   Intake/Output Summary (Last 24 hours) at 04/06/2014 0945 Last data filed at 04/05/2014 0800  Gross per 24 hour  Intake      0 ml  Output    125 ml  Net   -125 ml    Last BM: unknown   Labs:   Recent Labs Lab 04/20/2014 0421  NA 135  K 4.5  CL 103  CO2 20  BUN 17  CREATININE 1.08  CALCIUM 8.9  GLUCOSE 420*    CBG (last 3)   Recent Labs  04/03/2014 0413 04/20/2014 0620 04/22/2014 0728  GLUCAP 413* 381* 459*    Scheduled Meds: . antiseptic oral rinse  7 mL Mouth Rinse QID  . aspirin  81 mg Oral Daily  . azithromycin  500 mg Intravenous Q24H  . ceFEPime (MAXIPIME) IV  2 g Intravenous 3 times per day  . chlorhexidine  15 mL Mouth Rinse BID  . clopidogrel  75 mg Oral Daily  . gabapentin  800 mg Oral TID  . heparin  5,000 Units Subcutaneous 3 times per day  . levETIRAcetam  1,000 mg Per Tube BID  . pantoprazole (PROTONIX)  IV  40 mg Intravenous Q24H  . phenytoin  100 mg Per Tube q morning - 10a  . phenytoin  300 mg Per Tube Q2200  . vancomycin  1,000 mg Intravenous Q12H    Continuous Infusions: . insulin (NOVOLIN-R) infusion      Past Medical History  Diagnosis Date  . Diabetes mellitus without complication   . Seizures   . Cancer     Past Surgical History  Procedure Laterality Date  . Cardiac surgery    . Brain surgery      Molli Barrows, RD, LDN, [place] Pager [phone] After Hours Pager [phone]

## 2014-04-19 NOTE — ED Notes (Signed)
CBG 413 

## 2014-04-19 NOTE — Progress Notes (Signed)
[facility] Progress Note Patient Name: Aulton Routt DOB: 10-01-1950 MRN: [id number]   Date of Service  04/06/2014  HPI/Events of Note    eICU Interventions  hypophos-repleted     Intervention Category Intermediate Interventions: Electrolyte abnormality - evaluation and management  ALVA,RAKESH V. 04/06/2014, 5:57 PM

## 2014-04-19 NOTE — Progress Notes (Signed)
ANTIBIOTIC CONSULT NOTE - INITIAL  Pharmacy Consult for Vancomycin/Cefepime  Indication: rule out pneumonia and rule out sepsis  Allergies  Allergen Reactions  . Amoxicillin   . Lovastatin   . Niacin And Related   . Simvastatin     Patient Measurements: Height: 6\' 2"  (188 cm) Weight: 160 lb 15 oz (73 kg) IBW/kg (Calculated) : 82.2  Vital Signs: Temp: 101.7 F (38.7 C) (02/22 0527) Temp Source: Rectal (02/22 0527) BP: 103/63 mmHg (02/22 0507) Pulse Rate: 125 (02/22 0555)  Labs:  Recent Labs  04/17/2014 0421  WBC 18.5*  HGB 15.3  PLT 210  CREATININE 1.08   Estimated Creatinine Clearance: 70.4 mL/min (by C-G formula based on Cr of 1.08).  Medical History: Past Medical History  Diagnosis Date  . Diabetes mellitus without complication   . Seizures   . Cancer     Assessment: 64 y/o M transfer from [facility] for r/o sepsis/respiratory failure. WBC is elevated, renal function age appropriate (f/u UOP). Other labs as above. Received vancomycin and Levaquin prior to transfer.   Goal of Therapy:  Vancomycin trough level 15-20 mcg/ml  Plan:  -Vancomycin 1000 mg IV q12h -Cefepime 2g IV q8h -Trend WBC, temp, renal function  -Drug levels as indicated   Narda Bonds 04/15/2014,6:14 AM

## 2014-04-19 NOTE — Progress Notes (Signed)
UR Completed.  [phone] 04/18/2014

## 2014-04-19 NOTE — ED Notes (Signed)
ett placed by dr Lita Mains.

## 2014-04-19 NOTE — Consult Note (Signed)
Neurology Consultation Reason for Consult: Altered Mental Status Referring Physician: Gasper Lloyd   CC: Altered Mental status  History is obtained from:Neighbor, medical record.   HPI: Dylan Rogers is a 64 y.o. 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 I tried to call his wife, but did not receive an answer, however his neighbor Lewis Shock [phone]) was able to give some history. He apparently has been severely weak on the left side for the past two days. It is unclear how much, if any, weakness he had from the tumor, but clearly he had worsened significantly two days ago.   His neighbor tells me that his wife "did not want him resuscitated" but unfortunately did not have any other way of contacting her.   He has been getting more confused and unresponsive over that time period as well. He has not complained of headaches.   He was transferred to [facility] for treatment of presumed pneumonia(apparently no bed availability. On arrival here, he was severely tachypnic and intubated in the ER.   ROS:  Unable to obtain due to altered mental status.   Past Medical History  Diagnosis Date  . Diabetes mellitus without complication   . Seizures   . Cancer     Family History: Unable to assess secondary to patient's altered mental status.    Social History: Tob: Unable to assess secondary to patient's altered mental status.    Exam: Current vital signs: BP 125/83 mmHg  Pulse 129  Temp(Src) 98.1 F (36.7 C) (Axillary)  Resp 33  Ht 6\' 2"  (1.88 m)  Wt 73 kg (160 lb 15 oz)  BMI 20.65 kg/m2  SpO2 95% Vital signs in last 24 hours: Temp:  [98.1 F (36.7 C)] 98.1 F (36.7 C) (02/22 0419) Pulse Rate:  [129-141] 129 (02/22 0420) Resp:  [33-35] 33 (02/22 0420) BP: (125-168)/(83) 125/83 mmHg (02/22 0420) SpO2:  [95 %-100 %] 95 % (02/22 0420) FiO2 (%):  [40 %] 40 % (02/22 0414) Weight:  [73 kg (160 lb 15  oz)] 73 kg (160 lb 15 oz) (02/22 0423)  Physical Exam  Constitutional: Appears chronicaclly ill Psych: unresponsive Eyes: No scleral injection HENT: No OP obstrucion Head: Normocephalic.  Cardiovascular: Normal rate and regular rhythm.  Respiratory: tachypnic.  GI: Soft.  Skin: WDI  Neuro: Mental Status: Patient opens eyes to stim. He apparently was following some simple commands for nursing.  Cranial Nerves: II: does nto blink to threat. Pupils are equal, round, and reactive to light.   III,IV, VI: right gaze preference.  V: Facial sensation is symmetric to temperature VII: Facial movement is difficult to discern due to bag-mask ventilation.  VIII, X, XI, XII: Unable to assess secondary to patient's altered mental status.  Motor: He has a right hemiparesis, but does have some tone in that arm. He has some movemetn to nox stim in the arm and leg.  Sensory: As above Cerebellar: Unable to assess secondary to patient's altered mental status.     I have reviewed labs in epic and the results pertinent to this consultation are: cmp - elevated glucose.   I have reviewed the images obtained: CT head -   Impression: 64 yo M with pneumonia and sepsis with altered mental status. My suspicion is that this represents septic encephalopathy. His left sided weakness could be "peeling the onion", that is worsening of underlying deficits in teh setting of infection, but due  to the relatively rapid cahnge described I do think that an MRI would be prudent.   Recommendations: 1) MRI brain  2) Continue home keppra, gabapentin, dilantin 3) Will continue to follow.    Roland Rack, MD [facility] [phone]  If 7pm- 7am, please page neurology on call as listed in [place].

## 2014-04-19 NOTE — ED Notes (Signed)
Report given to Rebecca RN.

## 2014-04-19 NOTE — H&P (Signed)
PULMONARY / CRITICAL CARE MEDICINE HISTORY AND PHYSICAL EXAMINATION   Name: Dylan Rogers MRN: [id number] DOB: 1950/11/23    ADMISSION DATE:  04/24/2014  PRIMARY SERVICE: PCCM  CHIEF COMPLAINT:  Altered Mental status  BRIEF PATIENT DESCRIPTION: 64 y/o man with recent craniotomy for tx of astrocytoma who presents w/ fever, tachypnea, and AMS  SIGNIFICANT EVENTS / STUDIES:  Intubated in ED for respiratory distress  LINES / TUBES: ETT PIV x2 OGT Foley  CULTURES: Blood and urine cx obtained from [facility])  ANTIBIOTICS: Cefepime Vanc Azithro  HISTORY OF PRESENT ILLNESS:    Note, history of limited due to lack of complete records and unavailability of family members. History obtained from secondhand report from other providers and older records. 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 The patient was initially treated for sepsis, and had cultures and was given a dose of antibiotics. Hospitalits at [facility] accepted the patient to stepdown, but on arrival to [facility] was found to have rapid respiratory rate with inability to follow commands, and was intubated for respiratory failure.   PAST MEDICAL HISTORY :  Past Medical History  Diagnosis Date  . Diabetes mellitus without complication   . Seizures   . Cancer    Past Surgical History  Procedure Laterality Date  . Cardiac surgery    . Brain surgery     Prior to Admission medications   Medication Sig Start Date End Date Taking? Authorizing Provider  ALPRAZolam Duanne Moron) 0.5 MG tablet Take 0.5 mg by mouth at bedtime as needed for anxiety.   Yes Historical Provider, MD  aspirin 81 MG tablet Take 81 mg by mouth daily.   Yes Historical Provider, MD  atorvastatin (LIPITOR) 20 MG tablet Take 20 mg by mouth daily.   Yes Historical Provider, MD  clopidogrel (PLAVIX) 75  MG tablet Take 75 mg by mouth daily.   Yes Historical Provider, MD  enalapril (VASOTEC) 5 MG tablet Take 5 mg by mouth daily.   Yes Historical Provider, MD  gabapentin (NEURONTIN) 800 MG tablet Take 800 mg by mouth 3 (three) times daily.   Yes Historical Provider, MD  glipiZIDE (GLUCOTROL XL) 5 MG 24 hr tablet Take 5 mg by mouth daily with breakfast.   Yes Historical Provider, MD  insulin aspart (NOVOLOG) 100 UNIT/ML injection Inject into the skin 3 (three) times daily before meals. Solution 150-200=0 units 201-250=2 units 251-300=4 units 301-350=6 units 351-400=8 units Greater than 400= 10 units and notify doctor 3 mL   Yes Historical Provider, MD  levETIRAcetam (KEPPRA) 1000 MG tablet Take 1,000 mg by mouth 2 (two) times daily.   Yes Historical Provider, MD  LORazepam (ATIVAN) 0.5 MG tablet Take 0.5 mg by mouth daily as needed for anxiety.   Yes Historical Provider, MD  magnesium oxide (MAG-OX) 400 MG tablet Take 400 mg by mouth 2 (two) times daily.   Yes Historical Provider, MD  metFORMIN (GLUCOPHAGE) 850 MG tablet Take 850 mg by mouth 2 (two) times daily with a meal.   Yes Historical Provider, MD  metoprolol tartrate (LOPRESSOR) 25 MG tablet Take 25 mg by mouth daily.   Yes Historical Provider, MD  oxyCODONE-acetaminophen (PERCOCET) 10-325 MG per tablet Take 1 tablet by mouth every 4 (four) hours as needed for pain.   Yes Historical Provider, MD  phenytoin (DILANTIN) 100 MG ER capsule  Take by mouth 2 (two) times daily. Takes 100mg  in the AM and 300 mg in the PM   Yes Historical Provider, MD  saccharomyces boulardii (FLORASTOR) 250 MG capsule Take 250 mg by mouth 2 (two) times daily.   Yes Historical Provider, MD   Allergies  Allergen Reactions  . Amoxicillin   . Lovastatin   . Niacin And Related   . Simvastatin    FAMILY HISTORY:  Unable to obtain due to AMS  SOCIAL HISTORY:  reports that he has never smoked. He does not have any smokeless tobacco history on file. He reports that he does  not drink alcohol or use illicit drugs.  REVIEW OF SYSTEMS:  Unable to obtain due to AMS  SUBJECTIVE:   VITAL SIGNS: Temp:  [98.1 F (36.7 C)] 98.1 F (36.7 C) (02/22 0419) Pulse Rate:  [126-141] 126 (02/22 0507) Resp:  [24-35] 28 (02/22 0507) BP: (98-168)/(63-83) 103/63 mmHg (02/22 0507) SpO2:  [93 %-100 %] 97 % (02/22 0507) FiO2 (%):  [40 %] 40 % (02/22 0510) Weight:  [160 lb 15 oz (73 kg)] 160 lb 15 oz (73 kg) (02/22 0423) HEMODYNAMICS:   VENTILATOR SETTINGS: Vent Mode:  [-] PRVC FiO2 (%):  [40 %] 40 % Set Rate:  [18 bmp-22 bmp] 22 bmp Vt Set:  [500 mL-650 mL] 500 mL PEEP:  [5 cmH20] 5 cmH20 Plateau Pressure:  [11 cmH20] 11 cmH20 INTAKE / OUTPUT: Intake/Output    None     PHYSICAL EXAMINATION: General:  Chronically ill appearing man intubated lying on stretcher Neuro:  Sedated, agitated, moving all extremities HEENT:  Evidence of prior craniotomy. Neck: No JVD Cardiovascular:  Tachycardic, no M/R/G Lungs:  Ventilator sounds, no crackles Abdomen:  Soft Musculoskeletal:  No deformities Skin:  Intact  LABS:  CBC  Recent Labs Lab 04/24/2014 0421  WBC 18.5*  HGB 15.3  HCT 42.5  PLT 210   Coag's No results for input(s): APTT, INR in the last 168 hours. BMET  Recent Labs Lab 04/23/2014 0421  NA 135  K 4.5  CL 103  CO2 20  BUN 17  CREATININE 1.08  GLUCOSE 420*   Electrolytes  Recent Labs Lab 04/13/2014 0421  CALCIUM 8.9   Sepsis Markers  Recent Labs Lab 04/20/2014 0430  LATICACIDVEN 2.77*   ABG  Recent Labs Lab 04/18/2014 0505  PHART 7.415  PCO2ART 30.5*  PO2ART 121.0*   Liver Enzymes  Recent Labs Lab 04/23/2014 0421  AST 16  ALT 13  ALKPHOS 163*  BILITOT 0.9  ALBUMIN 2.8*   Cardiac Enzymes No results for input(s): TROPONINI, PROBNP in the last 168 hours. Glucose  Recent Labs Lab 04/15/2014 0413  GLUCAP 413*    Imaging Dg Chest Portable 1 View  04/24/2014   CLINICAL DATA:  Respiratory distress, code sepsis, intubated.   EXAM: PORTABLE CHEST - 1 VIEW  COMPARISON:  None.  FINDINGS: Endotracheal tube is 10 cm from the carina at the thoracic inlet. Enteric tube is in place, tip below the diaphragm, not included in the field of view. Patient is post median sternotomy. The heart size and mediastinal contours are normal. Pulmonary vasculature is normal. There is ill-defined opacity in the right lung base. No large pleural effusion or pneumothorax. No acute osseous abnormality is seen.  IMPRESSION: 1. Endotracheal tube 10 cm from the carina at the thoracic inlet. Enteric tube in place, tip not included in the field of view. 2. Ill-defined opacity at the right lung base, likely atelectasis. Pneumonia could have  a similar appearance.   Electronically Signed   By: Jeb Levering M.D.   On: 04/10/2014 04:55    EKG: Sinus tachycardia CXR: RLL infiltrate consistent with PNA  ASSESSMENT / PLAN:  Active Problems:   Sepsis   PULMONARY A: Hypoxia Respiratory Failure requiring mechanical ventilation Pneumonia P:   Maintain 6 cc/kg of IBW, adjust O2 to maintain SpO2 > 90%. Tx for HCAP / CAP with cefepime/vanc/azithro.  CARDIOVASCULAR A: Sinus tachycarida Hx of CAD on plavix P:   Unclear if patient has had PCI requiring stents and when these were placed. Continue plavix for now.  RENAL A: No active issues P:    GASTROINTESTINAL A: Need for enteral feeds Probable malnutrition P:   Thin habitus and low albumin suggestive of poor nutritional status. Will consult RD for tube feed recs  HEMATOLOGIC A: Apparent need for antiplatelet therapy Leukocytosis P:   Will need to speak with family to determine if pt has had recent stent or other strong indication for antiplatelet therapy. Leukocytosis due to sepsis.  INFECTIOUS A: Severe sepsis due to Pneumonia P:   Trend lactate, will tx with IVF, cefepime, azithro and vanc.  ENDOCRINE A: Hyperglycemia P: Will manage with ICU insulin  protocol  NEUROLOGIC A: Encephelopathy P:   Appreciate neuro recs; favor acute process driven by sepsis, in the setting of poor reserve with hx of astrocytoma and prior surgery.  BEST PRACTICE / DISPOSITION Level of Care:  ICU Primary Service:  PCCM Consultants:  Neurology Code Status:  Full Diet:  Tube feeds DVT Px:  heparin GI Px:  None indicated Skin Integrity:  Intact Social / Family:  Unable to contact  TODAY'S SUMMARY: 64 y/o man with sepsis and altered mental status with respiratory failure requiring mechanical ventilation.  I have personally obtained a history, examined the patient, evaluated laboratory and imaging results, formulated the assessment and plan and placed orders.  CRITICAL CARE: The patient is critically ill with multiple organ systems failure and requires high complexity decision making for assessment and support, frequent evaluation and titration of therapies, application of advanced monitoring technologies and extensive interpretation of multiple databases. Critical Care Time devoted to patient care services described in this note is 77 minutes.   Luz Brazen, MD Pulmonary and [place] Pager: [phone]   04/18/2014, 5:27 AM

## 2014-04-19 NOTE — ED Notes (Addendum)
Pt received 1 liter bolus at [facility] along with levoquin and vancomycin, fluids started at 2015 and abx started at 2245. Pt also given tylenol rectal 650 mg

## 2014-04-19 NOTE — ED Provider Notes (Signed)
CSN: [id number]     Arrival date & time 04/18/2014  0403 History   First MD Initiated Contact with Patient 03/30/2014 0423     Chief Complaint  Patient presents with  . Respiratory Distress  . Code Sepsis     (Consider location/radiation/quality/duration/timing/severity/associated sxs/prior Treatment) HPI Patient transferred from [facility] emergency department to [facility] for sepsis due to pneumonia. Accepted by Dr. Arnoldo Morale. Patient began having increased respiratory difficulty in route and coming less responsive. Being bagged by EMS. Patient is unresponsive and nonverbal. Level V caveat applies.  Past Medical History  Diagnosis Date  . Diabetes mellitus without complication   . Seizures   . Cancer    Past Surgical History  Procedure Laterality Date  . Cardiac surgery    . Brain surgery     History reviewed. No pertinent family history. History  Substance Use Topics  . Smoking status: Never Smoker   . Smokeless tobacco: Not on file  . Alcohol Use: No    Review of Systems  Unable to perform ROS: Mental status change      Allergies  Amoxicillin; Lovastatin; Niacin and related; and Simvastatin  Home Medications   Prior to Admission medications   Medication Sig Start Date End Date Taking? Authorizing Provider  ALPRAZolam Duanne Moron) 0.5 MG tablet Take 0.5 mg by mouth at bedtime as needed for anxiety.   Yes Historical Provider, MD  aspirin 81 MG tablet Take 81 mg by mouth daily.   Yes Historical Provider, MD  atorvastatin (LIPITOR) 20 MG tablet Take 20 mg by mouth daily.   Yes Historical Provider, MD  clopidogrel (PLAVIX) 75 MG tablet Take 75 mg by mouth daily.   Yes Historical Provider, MD  enalapril (VASOTEC) 5 MG tablet Take 5 mg by mouth daily.   Yes Historical Provider, MD  gabapentin (NEURONTIN) 800 MG tablet Take 800 mg by mouth 3 (three) times daily.   Yes Historical Provider, MD  glipiZIDE (GLUCOTROL XL) 5 MG 24 hr tablet Take 5 mg by mouth daily with breakfast.    Yes Historical Provider, MD  insulin aspart (NOVOLOG) 100 UNIT/ML injection Inject into the skin 3 (three) times daily before meals. Solution 150-200=0 units 201-250=2 units 251-300=4 units 301-350=6 units 351-400=8 units Greater than 400= 10 units and notify doctor 3 mL   Yes Historical Provider, MD  levETIRAcetam (KEPPRA) 1000 MG tablet Take 1,000 mg by mouth 2 (two) times daily.   Yes Historical Provider, MD  LORazepam (ATIVAN) 0.5 MG tablet Take 0.5 mg by mouth daily as needed for anxiety.   Yes Historical Provider, MD  magnesium oxide (MAG-OX) 400 MG tablet Take 400 mg by mouth 2 (two) times daily.   Yes Historical Provider, MD  metFORMIN (GLUCOPHAGE) 850 MG tablet Take 850 mg by mouth 2 (two) times daily with a meal.   Yes Historical Provider, MD  metoprolol tartrate (LOPRESSOR) 25 MG tablet Take 25 mg by mouth daily.   Yes Historical Provider, MD  oxyCODONE-acetaminophen (PERCOCET) 10-325 MG per tablet Take 1 tablet by mouth every 4 (four) hours as needed for pain.   Yes Historical Provider, MD  phenytoin (DILANTIN) 100 MG ER capsule Take by mouth 2 (two) times daily. Takes 100mg  in the AM and 300 mg in the PM   Yes Historical Provider, MD  saccharomyces boulardii (FLORASTOR) 250 MG capsule Take 250 mg by mouth 2 (two) times daily.   Yes Historical Provider, MD   BP 103/63 mmHg  Pulse 125  Temp(Src) 98.6 F (37  C) (Oral)  Resp 35  Ht 6\' 2"  (1.88 m)  Wt 160 lb 15 oz (73 kg)  BMI 20.65 kg/m2  SpO2 97% Physical Exam  Constitutional: He appears well-developed.  Chronically ill-appearing.  HENT:  Head: Normocephalic and atraumatic.  Dry mucous membranes. Nasal trumpet in place. Thick yellow mucus coming from the nasal trumpet  Eyes:  2 mm and minimally reactive.  Neck: Normal range of motion. Neck supple.  Cardiovascular: Regular rhythm.   Tachycardia  Pulmonary/Chest: He is in respiratory distress. He has no wheezes. He has rales.  Increased work of breathing. Rhonchi throughout   Abdominal: Soft. Bowel sounds are normal.  Musculoskeletal: Normal range of motion. He exhibits no edema or tenderness.  Neurological:  Intermittently following simple commands. Not moving the left upper extremity or lower extremity.  Skin: Skin is warm and dry. No rash noted. No erythema.  Nursing note and vitals reviewed.   ED Course  INTUBATION Date/Time: 04/14/2014 4:15 AM Performed by: Julianne Rice Authorized by: Lita Mains, Onisha Cedeno Consent: The procedure was performed in an emergent situation. Indications: respiratory distress Intubation method: video-assisted Preoxygenation: BVM Sedatives: etomidate Paralytic: succinylcholine Laryngoscope size: Mac 4 Tube size: 7.5 mm Tube type: cuffed Number of attempts: 1 Cricoid pressure: no Cords visualized: yes Post-procedure assessment: chest rise Breath sounds: equal Cuff inflated: yes ETT to lip: 25 cm Tube secured with: ETT holder Chest x-ray interpreted by me and radiologist. Chest x-ray findings: endotracheal tube in appropriate position Patient tolerance: Patient tolerated the procedure well with no immediate complications   (including critical care time) Labs Review Labs Reviewed  BRAIN NATRIURETIC PEPTIDE - Abnormal; Notable for the following:    B Natriuretic Peptide 164.2 (*)    All other components within normal limits  CBC WITH DIFFERENTIAL/PLATELET - Abnormal; Notable for the following:    WBC 18.5 (*)    Neutrophils Relative % 90 (*)    Neutro Abs 16.7 (*)    Lymphocytes Relative 3 (*)    Lymphs Abs 0.6 (*)    Monocytes Absolute 1.3 (*)    All other components within normal limits  COMPREHENSIVE METABOLIC PANEL - Abnormal; Notable for the following:    Glucose, Bld 420 (*)    Albumin 2.8 (*)    Alkaline Phosphatase 163 (*)    GFR calc non Af Amer 70 (*)    GFR calc Af Amer 81 (*)    All other components within normal limits  URINALYSIS, ROUTINE W REFLEX MICROSCOPIC - Abnormal; Notable for the  following:    APPearance CLOUDY (*)    Specific Gravity, Urine >1.030 (*)    Glucose, UA >1000 (*)    Hgb urine dipstick LARGE (*)    Bilirubin Urine SMALL (*)    Ketones, ur 15 (*)    Protein, ur >300 (*)    All other components within normal limits  TROPONIN I - Abnormal; Notable for the following:    Troponin I 0.35 (*)    All other components within normal limits  PROTIME-INR - Abnormal; Notable for the following:    Prothrombin Time 19.5 (*)    INR 1.63 (*)    All other components within normal limits  FIBRINOGEN - Abnormal; Notable for the following:    Fibrinogen >800 (*)    All other components within normal limits  GLUCOSE, CAPILLARY - Abnormal; Notable for the following:    Glucose-Capillary 381 (*)    All other components within normal limits  URINE MICROSCOPIC-ADD ON - Abnormal; Notable  for the following:    Bacteria, UA FEW (*)    Casts GRANULAR CAST (*)    All other components within normal limits  I-STAT CG4 LACTIC ACID, ED - Abnormal; Notable for the following:    Lactic Acid, Venous 2.77 (*)    All other components within normal limits  CBG MONITORING, ED - Abnormal; Notable for the following:    Glucose-Capillary 413 (*)    All other components within normal limits  I-STAT ARTERIAL BLOOD GAS, ED - Abnormal; Notable for the following:    pCO2 arterial 30.5 (*)    pO2, Arterial 121.0 (*)    Bicarbonate 19.6 (*)    Acid-base deficit 4.0 (*)    All other components within normal limits  CULTURE, BLOOD (ROUTINE X 2)  CULTURE, BLOOD (ROUTINE X 2)  URINE CULTURE  CULTURE, EXPECTORATED SPUTUM-ASSESSMENT  MRSA PCR SCREENING  CULTURE, RESPIRATORY (NON-EXPECTORATED)  PHENYTOIN LEVEL, TOTAL  APTT  LACTIC ACID, PLASMA  LACTIC ACID, PLASMA  CORTISOL  TYPE AND SCREEN    Imaging Review Dg Chest Portable 1 View  04/03/2014   CLINICAL DATA:  Respiratory distress, code sepsis, intubated.  EXAM: PORTABLE CHEST - 1 VIEW  COMPARISON:  None.  FINDINGS: Endotracheal  tube is 10 cm from the carina at the thoracic inlet. Enteric tube is in place, tip below the diaphragm, not included in the field of view. Patient is post median sternotomy. The heart size and mediastinal contours are normal. Pulmonary vasculature is normal. There is ill-defined opacity in the right lung base. No large pleural effusion or pneumothorax. No acute osseous abnormality is seen.  IMPRESSION: 1. Endotracheal tube 10 cm from the carina at the thoracic inlet. Enteric tube in place, tip not included in the field of view. 2. Ill-defined opacity at the right lung base, likely atelectasis. Pneumonia could have a similar appearance.   Electronically Signed   By: Jeb Levering M.D.   On: 04/14/2014 04:55     EKG Interpretation None     CRITICAL CARE Performed by: Lita Mains, Bryceton Hantz Total critical care time: 30 min Critical care time was exclusive of separately billable procedures and treating other patients. Critical care was necessary to treat or prevent imminent or life-threatening deterioration. Critical care was time spent personally by me on the following activities: development of treatment plan with patient and/or surrogate as well as nursing, discussions with consultants, evaluation of patient's response to treatment, examination of patient, obtaining history from patient or surrogate, ordering and performing treatments and interventions, ordering and review of laboratory studies, ordering and review of radiographic studies, pulse oximetry and re-evaluation of patient's condition.  MDM   Final diagnoses:  Left-sided weakness   Patient's labs repeated. Neurology at bedside to assess left-sided weakness. Discussed with critical care and will admit.     Julianne Rice, MD 04/08/2014 [phone]

## 2014-04-19 NOTE — Progress Notes (Signed)
[place] Progress Note Patient Name: Dylan Rogers DOB: 01-Dec-1950 MRN: [id number]   Date of Service  04/17/2014  HPI/Events of Note  Best Practice  eICU Interventions  Protonix for stress ulcer propy while intubated     Intervention Category Intermediate Interventions: Best-practice therapies (e.g. DVT, beta blocker, etc.)  Mayola Mcbain 03/31/2014, 6:22 AM

## 2014-04-19 NOTE — Progress Notes (Signed)
Per MD order, advanced ETT to 26 cm at the lip.

## 2014-04-20 ENCOUNTER — Inpatient Hospital Stay (HOSPITAL_COMMUNITY): Payer: Medicare Other

## 2014-04-20 DIAGNOSIS — R7881 Bacteremia: Secondary | ICD-10-CM

## 2014-04-20 DIAGNOSIS — D689 Coagulation defect, unspecified: Secondary | ICD-10-CM | POA: Insufficient documentation

## 2014-04-20 LAB — BLOOD GAS, ARTERIAL
ACID-BASE DEFICIT: 1 mmol/L (ref 0.0–2.0)
BICARBONATE: 22.7 meq/L (ref 20.0–24.0)
Drawn by: 33100
FIO2: 0.4 %
MECHVT: 560 mL
O2 SAT: 98 %
PATIENT TEMPERATURE: 99.8
PEEP/CPAP: 5 cmH2O
RATE: 16 resp/min
TCO2: 23.7 mmol/L (ref 0–100)
pCO2 arterial: 35.2 mmHg (ref 35.0–45.0)
pH, Arterial: 7.428 (ref 7.350–7.450)
pO2, Arterial: 108 mmHg — ABNORMAL HIGH (ref 80.0–100.0)

## 2014-04-20 LAB — GLUCOSE, CAPILLARY
GLUCOSE-CAPILLARY: 114 mg/dL — AB (ref 70–99)
GLUCOSE-CAPILLARY: 131 mg/dL — AB (ref 70–99)
GLUCOSE-CAPILLARY: 192 mg/dL — AB (ref 70–99)
GLUCOSE-CAPILLARY: 142 mg/dL — AB (ref 70–99)
GLUCOSE-CAPILLARY: 154 mg/dL — AB (ref 70–99)
GLUCOSE-CAPILLARY: 138 mg/dL — AB (ref 70–99)
GLUCOSE-CAPILLARY: 160 mg/dL — AB (ref 70–99)
GLUCOSE-CAPILLARY: 139 mg/dL — AB (ref 70–99)
GLUCOSE-CAPILLARY: 133 mg/dL — AB (ref 70–99)
GLUCOSE-CAPILLARY: 147 mg/dL — AB (ref 70–99)
GLUCOSE-CAPILLARY: 158 mg/dL — AB (ref 70–99)
Glucose-Capillary: 102 mg/dL — ABNORMAL HIGH (ref 70–99)
Glucose-Capillary: 151 mg/dL — ABNORMAL HIGH (ref 70–99)
Glucose-Capillary: 169 mg/dL — ABNORMAL HIGH (ref 70–99)
Glucose-Capillary: 225 mg/dL — ABNORMAL HIGH (ref 70–99)
Glucose-Capillary: 241 mg/dL — ABNORMAL HIGH (ref 70–99)
Glucose-Capillary: 264 mg/dL — ABNORMAL HIGH (ref 70–99)
Glucose-Capillary: 288 mg/dL — ABNORMAL HIGH (ref 70–99)
Glucose-Capillary: 313 mg/dL — ABNORMAL HIGH (ref 70–99)
Glucose-Capillary: 147 mg/dL — ABNORMAL HIGH (ref 70–99)
Glucose-Capillary: 183 mg/dL — ABNORMAL HIGH (ref 70–99)
Glucose-Capillary: 112 mg/dL — ABNORMAL HIGH (ref 70–99)
Glucose-Capillary: 134 mg/dL — ABNORMAL HIGH (ref 70–99)
Glucose-Capillary: 182 mg/dL — ABNORMAL HIGH (ref 70–99)
Glucose-Capillary: 133 mg/dL — ABNORMAL HIGH (ref 70–99)
Glucose-Capillary: 139 mg/dL — ABNORMAL HIGH (ref 70–99)
Glucose-Capillary: 84 mg/dL (ref 70–99)
Glucose-Capillary: 171 mg/dL — ABNORMAL HIGH (ref 70–99)
Glucose-Capillary: 212 mg/dL — ABNORMAL HIGH (ref 70–99)

## 2014-04-20 LAB — MAGNESIUM
Magnesium: 1.7 mg/dL (ref 1.5–2.5)
Magnesium: 2.5 mg/dL (ref 1.5–2.5)

## 2014-04-20 LAB — PHENYTOIN LEVEL, TOTAL: Phenytoin Lvl: 13.9 ug/mL (ref 10.0–20.0)

## 2014-04-20 LAB — COMPREHENSIVE METABOLIC PANEL
ALT: 13 U/L (ref 0–53)
ANION GAP: 9 (ref 5–15)
AST: 15 U/L (ref 0–37)
Albumin: 2.4 g/dL — ABNORMAL LOW (ref 3.5–5.2)
Alkaline Phosphatase: 117 U/L (ref 39–117)
BUN: 31 mg/dL — AB (ref 6–23)
CALCIUM: 8.9 mg/dL (ref 8.4–10.5)
CO2: 25 mmol/L (ref 19–32)
CREATININE: 1.14 mg/dL (ref 0.50–1.35)
Chloride: 104 mmol/L (ref 96–112)
GFR calc Af Amer: 76 mL/min — ABNORMAL LOW (ref >90)
GFR calc non Af Amer: 66 mL/min — ABNORMAL LOW (ref >90)
Glucose, Bld: 298 mg/dL — ABNORMAL HIGH (ref 70–99)
POTASSIUM: 4.3 mmol/L (ref 3.5–5.1)
Sodium: 138 mmol/L (ref 135–145)
TOTAL PROTEIN: 6.4 g/dL (ref 6.0–8.3)
Total Bilirubin: 0.8 mg/dL (ref 0.3–1.2)

## 2014-04-20 LAB — CBC
HCT: 37 % — ABNORMAL LOW (ref 39.0–52.0)
HEMOGLOBIN: 13.1 g/dL (ref 13.0–17.0)
MCH: 32.3 pg (ref 26.0–34.0)
MCHC: 35.4 g/dL (ref 30.0–36.0)
MCV: 91.1 fL (ref 78.0–100.0)
Platelets: 194 10*3/uL (ref 150–400)
RBC: 4.06 MIL/uL — ABNORMAL LOW (ref 4.22–5.81)
RDW: 12.3 % (ref 11.5–15.5)
WBC: 19.6 10*3/uL — ABNORMAL HIGH (ref 4.0–10.5)

## 2014-04-20 LAB — URINE CULTURE
COLONY COUNT: NO GROWTH
Culture: NO GROWTH

## 2014-04-20 LAB — PROTIME-INR
INR: 1.66 — ABNORMAL HIGH (ref 0.00–1.49)
Prothrombin Time: 19.8 seconds — ABNORMAL HIGH (ref 11.6–15.2)

## 2014-04-20 LAB — PHOSPHORUS
PHOSPHORUS: 2.5 mg/dL (ref 2.3–4.6)
Phosphorus: 2.8 mg/dL (ref 2.3–4.6)

## 2014-04-20 LAB — TROPONIN I: Troponin I: 0.16 ng/mL — ABNORMAL HIGH (ref <0.031)

## 2014-04-20 MED ORDER — MAGNESIUM SULFATE 2 GM/50ML IV SOLN: 2.0000 g | Freq: Once | INTRAVENOUS | Status: AC

## 2014-04-20 MED ORDER — CEFTRIAXONE SODIUM IN DEXTROSE 20 MG/ML IV SOLN
1.0000 g | INTRAVENOUS | Status: DC
  Administered 2014-04-20: 1 g via INTRAVENOUS
  Filled 2014-04-20 (×2): qty 50

## 2014-04-20 MED ORDER — SODIUM CHLORIDE 0.9 % IV SOLN
INTRAVENOUS | Status: DC
  Administered 2014-04-20 – 2014-04-23 (×4): via INTRAVENOUS

## 2014-04-20 MED ORDER — GABAPENTIN 250 MG/5ML PO SOLN
800.0000 mg | Freq: Three times a day (TID) | ORAL | Status: DC
  Administered 2014-04-20 – 2014-04-21 (×3): 800 mg
  Filled 2014-04-20 (×6): qty 16

## 2014-04-20 MED ORDER — MAGNESIUM SULFATE 2 GM/50ML IV SOLN
INTRAVENOUS | Status: AC
  Administered 2014-04-20: 2 g
  Filled 2014-04-20: qty 50

## 2014-04-20 NOTE — Progress Notes (Signed)
  Echocardiogram 2D Echocardiogram has been performed.  Lysle Rubens 04/20/2014, 4:10 PM

## 2014-04-20 NOTE — Progress Notes (Signed)
PULMONARY / CRITICAL CARE MEDICINE HISTORY AND PHYSICAL EXAMINATION   Name: Dylan Rogers MRN: [id number] DOB: November 04, 1950    ADMISSION DATE:  04/13/2014  PRIMARY SERVICE: PCCM  CHIEF COMPLAINT:  Altered Mental status  BRIEF PATIENT DESCRIPTION: 64 y/o man with recent craniotomy for tx of astrocytoma who presents w/ fever, tachypnea, and AMS  SIGNIFICANT EVENTS / STUDIES:  Intubated in ED for respiratory distress  LINES / TUBES: ETT PIV x2 OGT Foley  CULTURES: Blood and urine cx obtained from OSH [facility])  ANTIBIOTICS: Cefepime Vanc Azithro  SUBJECTIVE:  Patient not alert and not responsive to painful stimuli GCS 3 UOP 791ml Staph bacteremia  VITAL SIGNS: Temp:  [98 F (36.7 C)-102.9 F (39.4 C)] 100.9 F (38.3 C) (02/23 0434) Pulse Rate:  [114-132] 115 (02/23 0630) Resp:  [19-33] 24 (02/23 0630) BP: (89-159)/(58-97) 101/58 mmHg (02/23 0630) SpO2:  [95 %-100 %] 98 % (02/23 0630) FiO2 (%):  [40 %] 40 % (02/23 0400) Weight:  [174 lb 13.2 oz (79.3 kg)] 174 lb 13.2 oz (79.3 kg) (02/23 0325) HEMODYNAMICS:   VENTILATOR SETTINGS: Vent Mode:  [-] PRVC FiO2 (%):  [40 %] 40 % Set Rate:  [16 bmp-22 bmp] 16 bmp Vt Set:  [500 mL-560 mL] 560 mL PEEP:  [5 cmH20] 5 cmH20 Plateau Pressure:  [14 cmH20-18 cmH20] 16 cmH20 INTAKE / OUTPUT: Intake/Output      02/22 0701 - 02/23 0700   P.O. 120   I.V. (mL/kg) 423.4 (5.3)   NG/GT 488.5   IV Piggyback 1050   Total Intake(mL/kg) 2081.9 (26.3)   Urine (mL/kg/hr) 705 (0.4)   Total Output 705   Net +1376.9         PHYSICAL EXAMINATION: General:  Chronically ill appearing, intubated Neuro:  Non-sedated. Does not respond to any stimuli on my examination HEENT:  Evidence of prior craniotomy. Neck: No JVD Cardiovascular:  Tachycardic, no M/R/G Lungs:  Clear to auscultation on anterior auscultation Abdomen:  Soft Musculoskeletal:  S/p left transmetatarsal amputation Skin:  Intact  LABS:  CBC  Recent Labs Lab  04/06/2014 0421 04/20/14  WBC 18.5* 19.6*  HGB 15.3 13.1  HCT 42.5 37.0*  PLT 210 194   Coag's  Recent Labs Lab 04/08/2014 0425 04/20/14  APTT 33  --   INR 1.63* 1.66*   BMET  Recent Labs Lab 04/13/2014 0421 04/20/14  NA 135 138  K 4.5 4.3  CL 103 104  CO2 20 25  BUN 17 31*  CREATININE 1.08 1.14  GLUCOSE 420* 298*   Electrolytes  Recent Labs Lab 04/21/2014 0421 04/06/2014 1250 04/20/14  CALCIUM 8.9  --  8.9  MG  --  1.8 1.7  PHOS  --  1.3* 2.5   Sepsis Markers  Recent Labs Lab 04/15/2014 0430 04/23/2014 0652  LATICACIDVEN 2.77* 2.0   ABG  Recent Labs Lab 04/16/2014 0505 04/15/2014 1319  PHART 7.415 7.470*  PCO2ART 30.5* 28.6*  PO2ART 121.0* 65.0*   Liver Enzymes  Recent Labs Lab 04/14/2014 0421 04/20/14  AST 16 15  ALT 13 13  ALKPHOS 163* 117  BILITOT 0.9 0.8  ALBUMIN 2.8* 2.4*   Cardiac Enzymes  Recent Labs Lab 03/29/2014 1250 04/10/2014 1845 04/20/14  TROPONINI 0.14* 0.07* 0.16*   Glucose  Recent Labs Lab 04/16/2014 2224 04/05/2014 2317 04/20/14 0019 04/20/14 0132 04/20/14 0225 04/20/14 0323  GLUCAP 288* 313* 264* 241* 225* 192*    Imaging Mr Brain Wo Contrast  04/05/2014   CLINICAL DATA:  Sepsis. Altered mental status. History of  GBM. Initial encounter.  EXAM: MRI HEAD WITHOUT CONTRAST  TECHNIQUE: Multiplanar, multiecho pulse sequences of the brain and surrounding structures were obtained without intravenous contrast.  COMPARISON:  None.  FINDINGS: Study was ordered without contrast. Sensitivity in the detection of residual or recurrent disease is limited in the absence of IV gadolinium.  The patient was unable to remain motionless for the exam. Small or subtle lesions could be overlooked.  Foci of restricted diffusion in the RIGHT hemisphere correspond roughly to the posterior limb internal capsule and corticospinal tract, RIGHT MCA lenticulostriate territory. No definite acute hemorrhage, mass lesion, or extra-axial fluid.  Premature for age cerebral  and cerebellar atrophy. Status post RIGHT temporal craniectomy, with extensive anterior temporal lobectomy. Fluid-filled cavity and area of encephalomalacia. No regional vasogenic edema.  Widespread confluent subcortical and periventricular white matter primarily supratentorial in location, likely post treatment effect from pole hit radiation. Scattered areas of chronic lacunar infarction versus prominent perivascular spaces are seen in the RIGHT lentiform nucleus and RIGHT thalamus.  Flow voids are maintained in the internal carotid arteries, basilar artery, both vertebral arteries, and the proximal M1 MCA segments. It is difficult to confirm or exclude disease in the distal RIGHT M1 or RIGHT MCA trifurcation branches, although flow voids appear maintained in the MCA vessels within the sylvian fissure. If further investigation is desired, regarding the intracranial circulation, MRA intracranial without contrast could be helpful in further evaluation.  Mild chronic ethmoid and maxillary sinus disease. Acute and chronic RIGHT sphenoid sinus disease. No midline abnormality. Negative orbits. Trace BILATERAL mastoid effusions.  IMPRESSION: Based on the observed noncontrast images, there are no features to strongly suggest recurrent tumor.  Acute deep white matter infarction affecting the RIGHT posterior limb internal capsule and centrum semiovale. These lie within the territory of the RIGHT MCA lenticulostriate branches.  If further investigation is desired, consider MRA intracranial for further evaluation when the patient is more cooperative, along with post infusion imaging, if no contraindications to gadolinium.   Electronically Signed   By: Rolla Flatten M.D.   On: 04/14/2014 16:30   Dg Chest Port 1 View  04/03/2014   CLINICAL DATA:  Intubation.  EXAM: PORTABLE CHEST - 1 VIEW  COMPARISON:  03/30/2014  FINDINGS: Endotracheal tube is approximately 7 cm above the carina. NG tube is in the stomach. Patchy airspace  disease in the right lower lobe is similar to prior study. Left lung is clear. No effusions. Heart is normal size. Prior median sternotomy.  IMPRESSION: Endotracheal tube approximately 7 cm above the carina.  Continued patchy right lower lobe airspace disease.   Electronically Signed   By: Rolm Baptise M.D.   On: 04/04/2014 12:37   Dg Chest Portable 1 View  03/30/2014   CLINICAL DATA:  Respiratory distress, code sepsis, intubated.  EXAM: PORTABLE CHEST - 1 VIEW  COMPARISON:  None.  FINDINGS: Endotracheal tube is 10 cm from the carina at the thoracic inlet. Enteric tube is in place, tip below the diaphragm, not included in the field of view. Patient is post median sternotomy. The heart size and mediastinal contours are normal. Pulmonary vasculature is normal. There is ill-defined opacity in the right lung base. No large pleural effusion or pneumothorax. No acute osseous abnormality is seen.  IMPRESSION: 1. Endotracheal tube 10 cm from the carina at the thoracic inlet. Enteric tube in place, tip not included in the field of view. 2. Ill-defined opacity at the right lung base, likely atelectasis. Pneumonia could have a similar  appearance.   Electronically Signed   By: Jeb Levering M.D.   On: 04/20/2014 04:55   Dg Abd Portable 1v  04/13/2014   CLINICAL DATA:  Nasogastric catheter repositioning  EXAM: PORTABLE ABDOMEN - 1 VIEW  COMPARISON:  Film from earlier in the same day  FINDINGS: The nasogastric catheter is been withdrawn slightly and on coiled within the stomach. It lies in the proximal portion of the stomach. Scattered large and small bowel gas is noted without obstructive pattern. No other focal abnormality is seen.  IMPRESSION: Nasogastric catheter within the stomach proximally. The previously seen loop has been reduced.   Electronically Signed   By: Inez Catalina M.D.   On: 04/10/2014 08:30   Dg Abd Portable 1v  04/14/2014   CLINICAL DATA:  NG tube placement.  EXAM: PORTABLE ABDOMEN - 1 VIEW   COMPARISON:  Chest radiograph -earlier same day  FINDINGS: Enteric tube tip and side port projects over the gastric fundus.  Paucity of bowel gas without definite evidence of obstruction.  No supine evidence of pneumoperitoneum, though note, the right upper abdominal quadrant is excluded from view.  Limited visualization of lower thorax demonstrates sequela of prior median sternotomy and potential minimal right basilar opacities, incompletely evaluated.  No acute osseus abnormalities.  IMPRESSION: Enteric tube tip and side port projects over the gastric fundus.   Electronically Signed   By: Sandi Mariscal M.D.   On: 04/13/2014 08:13    EKG: Sinus tachycardia CXR: RLL infiltrate consistent with PNA  ASSESSMENT / PLAN:  Active Problems:   Sepsis   Malnutrition of moderate degree   Endotracheally intubated   Left-sided weakness   Nasogastric tube fed newborn   PULMONARY A: Hypoxia Respiratory Failure requiring mechanical ventilation Pneumonia P:   7 cc/kg and rate at 16, abg to follow , likely need to reduce rate further Keep same MV Wean with no intention to extubate today given neurostatus Allow pos balance  CARDIOVASCULAR A: Sinus tachycarida Hx of CAD on plavix P:   plavix Tele Pos balance , la repeated and reassuring pos balance goals  RENAL A: Severe sepsis P:   Pos balance LA repeat re assuring Chem in am  Add saline back  GASTROINTESTINAL A: Need for enteral feeds Severe malnutrition P:   Thin habitus and low albumin suggestive of poor nutritional status TF, pepup  HEMATOLOGIC A: Apparent need for antiplatelet therapy Leukocytosis coagulapthy ( cause unknown) - sepsis, liver>>? 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 Found to have staph bacteremia  Cordelia Poche, MD PGY-2, [facility] 04/20/2014, 6:40 AM  STAFF NOTE: I, Merrie Roof, MD FACP have personally reviewed patient's available data, including medical history, events of note, physical examination and test results as part of my evaluation. I have discussed with resident/NP and other care providers such as pharmacist, RN and RRT. In addition, I personally evaluated patient and elicited key findings of: need to discuss with neuro levels dilantin etc, abg repeat needed, likley to reduce mV further, CVA small, asa, ID to see as bacteremia, change to ceftriaxone, trating bactereami follow enceph The patient is critically ill with multiple organ systems failure and requires high complexity decision making for assessment and support, frequent evaluation and titration of therapies, application of advanced monitoring technologies and extensive interpretation of multiple databases.   Critical Care Time devoted to patient care services described in this note is30 Minutes. This time reflects time of care of this signee: Merrie Roof, MD FACP. This critical care time does not reflect procedure  time, or teaching time or supervisory time of PA/NP/Med student/Med Resident etc but could involve care discussion time. Rest per NP/medical resident whose note is outlined above and that I agree with   Lavon Paganini. Titus Mould, MD, [place] Pgr: [facility] 04/20/2014 9:25 AM

## 2014-04-20 NOTE — Progress Notes (Signed)
STROKE TEAM PROGRESS NOTE   HISTORY Dylan Rogers is a 64 y.o. 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 History from wife who states patient had decreased oral intake over the last week and also about 1 week ago was leaning to the left while walking.  Prior to this he was able to walk with a cane but did have residual weakness on the left from previous brain surgery.  He has a h/o seizures as well with last seizure 03/05/14 and he has had 3 seizures from November until now.  Prodrome to his seizures is twitching of the right eye and wife states he was compliant with seizure medications (Dilantin 400 mg and Keppra 3000 mg) His wife states he does have word finding difficultly since having the resection of the tumor and intermittent confusion.  He was not independent at home and needed help dressing. 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   He presented with acute encephalopathy and likely sepsis 2/2 pneumonia on 2/22    Patient was not administered TPA. He was admitted to the ICU on 2/22 for further evaluation and treatment.   SUBJECTIVE (INTERVAL HISTORY) His wife is at the bedside.  Overall patient is sedated (last Fentanyl bolus was at 4:19 am), not following commands. He appears ill.    OBJECTIVE Temp:  [98 F (36.7 C)-102.9 F (39.4 C)] 99.8 F (37.7 C) (02/23 0802) Pulse Rate:  [112-132] 112 (02/23 0727) Cardiac Rhythm:  [-] Sinus tachycardia (02/23 0400) Resp:  [19-33] 24 (02/23 0727) BP: (89-134)/(58-85) 117/85 mmHg (02/23 0727) SpO2:  [95 %-100 %] 99 % (02/23 0727) FiO2 (%):  [40 %] 40 % (02/23 0727) Weight:  [79.3 kg (174 lb 13.2 oz)] 79.3 kg (174 lb 13.2 oz) (02/23 0325)   Recent Labs Lab  04/20/14 0428 04/20/14 0518 04/20/14 0610 04/20/14 0655 04/20/14 0754  GLUCAP 142* 183* 147* 154* 138*    Recent Labs Lab 04/24/2014 0421 04/17/2014 1250 04/20/14  NA 135  --  138  K 4.5  --  4.3  CL 103  --  104  CO2 20  --  25  GLUCOSE 420*  --  298*  BUN 17  --  31*  CREATININE 1.08  --  1.14  CALCIUM 8.9  --  8.9  MG  --  1.8 1.7  PHOS  --  1.3* 2.5    Recent Labs Lab 04/15/2014 0421 04/20/14  AST 16 15  ALT 13 13  ALKPHOS 163* 117  BILITOT 0.9 0.8  PROT 7.3 6.4  ALBUMIN 2.8* 2.4*    Recent Labs Lab 04/15/2014 0421 04/20/14  WBC 18.5* 19.6*  NEUTROABS 16.7*  --   HGB 15.3 13.1  HCT 42.5 37.0*  MCV 90.0 91.1  PLT 210 194    Recent Labs Lab 04/03/2014 0425 04/09/2014 1250 04/04/2014 1845 04/20/14  TROPONINI 0.35* 0.14* 0.07* 0.16*    Recent Labs  04/01/2014 0425 04/20/14  LABPROT 19.5* 19.8*  INR 1.63* 1.66*    Recent Labs  04/18/2014 0612  COLORURINE YELLOW  LABSPEC >1.030*  PHURINE 6.0  GLUCOSEU >1000*  HGBUR LARGE*  BILIRUBINUR SMALL*  KETONESUR 15*  PROTEINUR >300*  UROBILINOGEN 1.0  NITRITE NEGATIVE  LEUKOCYTESUR NEGATIVE  Mr Brain Wo Contrast  04/24/2014   CLINICAL DATA:  Sepsis. Altered mental status. History of GBM. Initial encounter.  EXAM: MRI HEAD WITHOUT CONTRAST  TECHNIQUE: Multiplanar, multiecho pulse sequences of the brain and surrounding structures were obtained without intravenous contrast.  COMPARISON:  None.  FINDINGS: Study was ordered without contrast. Sensitivity in the detection of residual or recurrent disease is limited in the absence of IV gadolinium.  The patient was unable to remain motionless for the exam. Small or subtle lesions could be overlooked.  Foci of restricted diffusion in the RIGHT hemisphere correspond roughly to the posterior limb internal capsule and corticospinal tract, RIGHT MCA lenticulostriate territory. No definite acute hemorrhage, mass lesion, or extra-axial fluid.  Premature for age cerebral and  cerebellar atrophy. Status post RIGHT temporal craniectomy, with extensive anterior temporal lobectomy. Fluid-filled cavity and area of encephalomalacia. No regional vasogenic edema.  Widespread confluent subcortical and periventricular white matter primarily supratentorial in location, likely post treatment effect from pole hit radiation. Scattered areas of chronic lacunar infarction versus prominent perivascular spaces are seen in the RIGHT lentiform nucleus and RIGHT thalamus.  Flow voids are maintained in the internal carotid arteries, basilar artery, both vertebral arteries, and the proximal M1 MCA segments. It is difficult to confirm or exclude disease in the distal RIGHT M1 or RIGHT MCA trifurcation branches, although flow voids appear maintained in the MCA vessels within the sylvian fissure. If further investigation is desired, regarding the intracranial circulation, MRA intracranial without contrast could be helpful in further evaluation.  Mild chronic ethmoid and maxillary sinus disease. Acute and chronic RIGHT sphenoid sinus disease. No midline abnormality. Negative orbits. Trace BILATERAL mastoid effusions.  IMPRESSION: Based on the observed noncontrast images, there are no features to strongly suggest recurrent tumor.  Acute deep white matter infarction affecting the RIGHT posterior limb internal capsule and centrum semiovale. These lie within the territory of the RIGHT MCA lenticulostriate branches.  If further investigation is desired, consider MRA intracranial for further evaluation when the patient is more cooperative, along with post infusion imaging, if no contraindications to gadolinium.   Electronically Signed   By: Rolla Flatten M.D.   On: 04/24/2014 16:30   Dg Chest Port 1 View  04/20/2014   CLINICAL DATA:  Short of breath  EXAM: PORTABLE CHEST - 1 VIEW  COMPARISON:  Yesterday  FINDINGS: Endotracheal and NG tubes are stable. Increasing consolidation at the right lung base. Minimal hazy  opacity at the left base. No pneumothorax.  IMPRESSION: Increasing airspace disease at the right base. Minimal hazy airspace disease persists at the left base. Stable endotracheal tube.   Electronically Signed   By: Marybelle Killings M.D.   On: 04/20/2014 07:32   Dg Chest Port 1 View  04/22/2014   CLINICAL DATA:  Intubation.  EXAM: PORTABLE CHEST - 1 VIEW  COMPARISON:  04/13/2014  FINDINGS: Endotracheal tube is approximately 7 cm above the carina. NG tube is in the stomach. Patchy airspace disease in the right lower lobe is similar to prior study. Left lung is clear. No effusions. Heart is normal size. Prior median sternotomy.  IMPRESSION: Endotracheal tube approximately 7 cm above the carina.  Continued patchy right lower lobe airspace disease.   Electronically Signed   By: Rolm Baptise M.D.   On: 04/10/2014 12:37   Dg Chest Portable 1 View  04/01/2014   CLINICAL DATA:  Respiratory distress, code sepsis, intubated.  EXAM: PORTABLE CHEST - 1 VIEW  COMPARISON:  None.  FINDINGS: Endotracheal  tube is 10 cm from the carina at the thoracic inlet. Enteric tube is in place, tip below the diaphragm, not included in the field of view. Patient is post median sternotomy. The heart size and mediastinal contours are normal. Pulmonary vasculature is normal. There is ill-defined opacity in the right lung base. No large pleural effusion or pneumothorax. No acute osseous abnormality is seen.  IMPRESSION: 1. Endotracheal tube 10 cm from the carina at the thoracic inlet. Enteric tube in place, tip not included in the field of view. 2. Ill-defined opacity at the right lung base, likely atelectasis. Pneumonia could have a similar appearance.   Electronically Signed   By: Jeb Levering M.D.   On: 03/29/2014 04:55   Dg Abd Portable 1v  04/24/2014   CLINICAL DATA:  Nasogastric catheter repositioning  EXAM: PORTABLE ABDOMEN - 1 VIEW  COMPARISON:  Film from earlier in the same day  FINDINGS: The nasogastric catheter is been withdrawn  slightly and on coiled within the stomach. It lies in the proximal portion of the stomach. Scattered large and small bowel gas is noted without obstructive pattern. No other focal abnormality is seen.  IMPRESSION: Nasogastric catheter within the stomach proximally. The previously seen loop has been reduced.   Electronically Signed   By: Inez Catalina M.D.   On: 04/17/2014 08:30   Dg Abd Portable 1v  04/16/2014   CLINICAL DATA:  NG tube placement.  EXAM: PORTABLE ABDOMEN - 1 VIEW  COMPARISON:  Chest radiograph -earlier same day  FINDINGS: Enteric tube tip and side port projects over the gastric fundus.  Paucity of bowel gas without definite evidence of obstruction.  No supine evidence of pneumoperitoneum, though note, the right upper abdominal quadrant is excluded from view.  Limited visualization of lower thorax demonstrates sequela of prior median sternotomy and potential minimal right basilar opacities, incompletely evaluated.  No acute osseus abnormalities.  IMPRESSION: Enteric tube tip and side port projects over the gastric fundus.   Electronically Signed   By: Sandi Mariscal M.D.   On: 04/07/2014 08:13     PHYSICAL EXAM General: frail middle aged Caucasian male who is sedated and intubated Mental Status: Encephalopathic, eyes closed, not following commands or w/d pain all 4 ext  Cranial Nerves: II: pupils constricted though equal, round, reactive to light and accommodation III,IV, [id number] to assess due to mental status V,VII: unable to assess due to mental status VIII: unable to assess due to mental status IX,X: gag reflex present XI: unable to assess due to mental status XII: unable to assess due to mental status  Motor: Able to maintain tone in right and lower extremities though LLE and LUE with mild weakness  Tone and bulk:normal tone throughout; no atrophy noted  Sensory: unable to assess due to mental status Deep Tendon Reflexes:  Not assessed   Plantars: Right:  downgoing   Left: downgoing Cerebellar: unable to assess due to mental status Gait: unable to assess due to mental status CV: pulses palpable throughout  Ext: :Left foot with transmetatarsal amputation  ASSESSMENT/PLAN Mr. Dylan Rogers is a 64 y.o. male with history of uncontrolled DM, seizures, s/p GBM resection presenting with acute encephalopathy. He did not receive IV t-PA.   Acute encephalopathy and acute deep white matter infarction (right posterior limb internal capsule and centrum semiovale) likely 2/2 small vessel disease  Resultant  Left hemiparesis, encephalopathy likely related to underlying infection metabolic issues  MRI  2/72 Acute deep white matter infarction affecting the RIGHT posterior  limb internal capsule and centrum semiovale. These lie within the territory of the RIGHT MCA lenticulostriate branches.  MRA  Not obtained   Carotid Doppler  Pending   2D Echo  pending  EEG pending  LDL pending  HgbA1c pending  Heparin for VTE prophylaxis  Tube feeds for nutrition   aspirin 81 mg orally every day and clopidogrel 75 mg orally every day prior to admission, now on aspirin 81 mg orally every day and clopidogrel 75 mg orally every day  Wife counseled to be compliant with his antithrombotic medications  Ongoing aggressive stroke risk factor management  Will not do aggressive stroke w/u  Therapy recommendations:  PT, OT, SLP when able  Disposition:  As determined by PT, OT  Hypertension  Home meds:   Vasotec 5 mg qd, Lopressor 25 mg qd  Stable  Wife counseled to be compliant with his blood pressure medications  Hyperlipidemia  Home meds:  Lipitor 20 mg qd will need to be resumed in hospital  LDL pending, goal < 70  Continue statin at discharge  Diabetes  HgbA1c pending, goal < 7.0  Uncontrolled  Other Stroke Risk Factors  Advanced age  Cigarette smoker, advised to stop smoking  History ETOH use  Family hx stroke unknown  Other Active  Problems  History of seizures-will check keppra and dilantin levels in am.  Rec. Primary continue home medications as was taking (per wife patient was taking Dilantin 400 mg total and Keppra 3000 mg total per day have pharmacy confirm doses and start medications as was taking at home); pending EEG  Acute encephalopathy infectious vs metabolic vs seizures (see above)  Sepsis likely 2/2 aspiration pneumonia per primary team-on abx   Other Pertinent History  Other medical issues per primary team  Hospital day # 1  Karlyn Agee MD PGY 3 IM  I have personally examined this patient, reviewed notes, independently viewed imaging studies, participated in medical decision making and plan of care. I have made any additions or clarifications directly to the above note. Agree with note above. He has presented with subacute decline in his health with increasing prior left-sided weakness in the setting of sepsis with gram-positive bacteremia and a small tiny right internal capsule infarct likely from small vessel disease. He is at risk for recurrent strokes and TIAs and will continue ongoing stroke evaluation. Long discussion with patient's wife at the bedside and answered questions. This patient is critically ill and at significant risk of neurological worsening, death and care requires constant monitoring of vital signs, hemodynamics,respiratory and cardiac monitoring,review of multiple databases, neurological assessment, discussion with family, other specialists and medical decision making of high complexity.I have made any additions or clarifications directly to the above note.  I spent 30 minutes of neurocritical care time  in the care of  this patient.  Antony Contras, MD Medical Director [facility] Pager: [phone] 04/20/2014 2:11 PM    To contact Stroke Continuity provider, please refer to [email or web address] After hours, contact General Neurology

## 2014-04-20 NOTE — Progress Notes (Signed)
CRITICAL VALUE ALERT  Critical value received:  Positive blood culture for gram POSITIVE cocci clusters  Date of notification:  04/20/14  Time of notification:  0615  Critical value read back:Yes.    Nurse who received alert:  Allegra Grana, RN  MD notified (1st page):  Dr Deterding  Time of first page:  0615  MD notified (2nd page):  Time of second page:  Responding MD:  Dr Jimmy Footman  Time MD responded:  [phone]

## 2014-04-21 ENCOUNTER — Inpatient Hospital Stay (HOSPITAL_COMMUNITY): Payer: Medicare Other

## 2014-04-21 LAB — GLUCOSE, CAPILLARY
GLUCOSE-CAPILLARY: 319 mg/dL — AB (ref 70–99)
GLUCOSE-CAPILLARY: 268 mg/dL — AB (ref 70–99)
GLUCOSE-CAPILLARY: 216 mg/dL — AB (ref 70–99)
GLUCOSE-CAPILLARY: 131 mg/dL — AB (ref 70–99)
Glucose-Capillary: 157 mg/dL — ABNORMAL HIGH (ref 70–99)
Glucose-Capillary: 165 mg/dL — ABNORMAL HIGH (ref 70–99)
Glucose-Capillary: 227 mg/dL — ABNORMAL HIGH (ref 70–99)
Glucose-Capillary: 241 mg/dL — ABNORMAL HIGH (ref 70–99)
Glucose-Capillary: 296 mg/dL — ABNORMAL HIGH (ref 70–99)
Glucose-Capillary: 300 mg/dL — ABNORMAL HIGH (ref 70–99)
Glucose-Capillary: 325 mg/dL — ABNORMAL HIGH (ref 70–99)
Glucose-Capillary: 357 mg/dL — ABNORMAL HIGH (ref 70–99)

## 2014-04-21 LAB — BASIC METABOLIC PANEL
Anion gap: 8 (ref 5–15)
BUN: 34 mg/dL — ABNORMAL HIGH (ref 6–23)
CHLORIDE: 109 mmol/L (ref 96–112)
CO2: 23 mmol/L (ref 19–32)
Calcium: 8.4 mg/dL (ref 8.4–10.5)
Creatinine, Ser: 1 mg/dL (ref 0.50–1.35)
GFR, EST AFRICAN AMERICAN: 89 mL/min — AB (ref >90)
GFR, EST NON AFRICAN AMERICAN: 77 mL/min — AB (ref >90)
Glucose, Bld: 276 mg/dL — ABNORMAL HIGH (ref 70–99)
POTASSIUM: 3.7 mmol/L (ref 3.5–5.1)
Sodium: 140 mmol/L (ref 135–145)

## 2014-04-21 LAB — LIPID PANEL
Cholesterol: 134 mg/dL (ref 0–200)
HDL: 21 mg/dL — AB (ref >39)
LDL CALC: 80 mg/dL (ref 0–99)
TRIGLYCERIDES: 164 mg/dL — AB (ref <150)
Total CHOL/HDL Ratio: 6.4 RATIO
VLDL: 33 mg/dL (ref 0–40)

## 2014-04-21 LAB — CBC
HCT: 33.4 % — ABNORMAL LOW (ref 39.0–52.0)
Hemoglobin: 11.4 g/dL — ABNORMAL LOW (ref 13.0–17.0)
MCH: 31.6 pg (ref 26.0–34.0)
MCHC: 34.1 g/dL (ref 30.0–36.0)
MCV: 92.5 fL (ref 78.0–100.0)
PLATELETS: 219 10*3/uL (ref 150–400)
RBC: 3.61 MIL/uL — AB (ref 4.22–5.81)
RDW: 12.6 % (ref 11.5–15.5)
WBC: 18.1 10*3/uL — AB (ref 4.0–10.5)

## 2014-04-21 LAB — CULTURE, BLOOD (ROUTINE X 2)

## 2014-04-21 MED ORDER — MORPHINE SULFATE 25 MG/ML IV SOLN
1.0000 mg/h | INTRAVENOUS | Status: DC
  Administered 2014-04-21 – 2014-04-24 (×5): 10 mg/h via INTRAVENOUS
  Filled 2014-04-21 (×5): qty 10

## 2014-04-21 MED ORDER — INSULIN ASPART 100 UNIT/ML ~~LOC~~ SOLN
0.0000 [IU] | Freq: Three times a day (TID) | SUBCUTANEOUS | Status: DC
  Administered 2014-04-21: 3 [IU] via SUBCUTANEOUS

## 2014-04-21 MED ORDER — INSULIN GLARGINE 100 UNIT/ML ~~LOC~~ SOLN
20.0000 [IU] | Freq: Every day | SUBCUTANEOUS | Status: DC
  Administered 2014-04-21: 20 [IU] via SUBCUTANEOUS
  Filled 2014-04-21: qty 0.2

## 2014-04-21 MED ORDER — MORPHINE BOLUS VIA INFUSION
5.0000 mg | INTRAVENOUS | Status: DC | PRN
  Filled 2014-04-21: qty 20

## 2014-04-21 NOTE — Progress Notes (Signed)
PULMONARY / CRITICAL CARE MEDICINE HISTORY AND PHYSICAL EXAMINATION   Name: Dylan Rogers MRN: [id number] DOB: July 27, 1950    ADMISSION DATE:  03/30/2014  PRIMARY SERVICE: PCCM  CHIEF COMPLAINT:  Altered Mental status  BRIEF PATIENT DESCRIPTION: 64 y/o man with recent craniotomy for tx of astrocytoma who presents w/ fever, tachypnea, and AMS  SIGNIFICANT EVENTS / STUDIES:  Intubated in ED for respiratory distress 2/22: EEG suggested encephalopathy and possible seizure focus of right temporal region 2/23: Echo with EF 45-50%, hypokinesis, LVH; US shows cholelithiasis only  LINES / TUBES: ETT PIV x2 OGT Foley  CULTURES: Blood and urine cx obtained from [facility])  ANTIBIOTICS: Cefepime Vanc Azithro  SUBJECTIVE:  GCS 6 UOP 1.1L Mental status not significantly improved Febrile to 100.4 overnight  VITAL SIGNS: Temp:  [98.7 F (37.1 C)-100.5 F (38.1 C)] 100.5 F (38.1 C) (02/24 0428) Pulse Rate:  [29-115] 113 (02/24 0600) Resp:  [19-27] 27 (02/24 0600) BP: (105-163)/(52-85) 163/83 mmHg (02/24 0600) SpO2:  [96 %-100 %] 100 % (02/24 0600) FiO2 (%):  [40 %] 40 % (02/24 0600) Weight:  [179 lb 3.7 oz (81.3 kg)] 179 lb 3.7 oz (81.3 kg) (02/24 0311) HEMODYNAMICS:   VENTILATOR SETTINGS: Vent Mode:  [-] PRVC FiO2 (%):  [40 %] 40 % Set Rate:  [16 bmp] 16 bmp Vt Set:  [560 mL] 560 mL PEEP:  [5 cmH20] 5 cmH20 Plateau Pressure:  [15 cmH20-21 cmH20] 16 cmH20 INTAKE / OUTPUT: Intake/Output      02/23 0701 - 02/24 0700   I.V. (mL/kg) 1412.8 (17.4)   Other 300   NG/GT 1676.7   IV Piggyback 500   Total Intake(mL/kg) 3889.5 (47.8)   Urine (mL/kg/hr) 1135 (0.6)   Emesis/NG output 50 (0)   Total Output 1185   Net +2704.5         PHYSICAL EXAMINATION: General:  Chronically ill appearing, intubated Neuro:  Non-sedated. Responded when palpating abdomen HEENT:  Evidence of prior craniotomy. Neck: No JVD Cardiovascular:  Tachycardic, no M/R/G Lungs:  Clear to  auscultation on anterior auscultation Abdomen:  Soft Musculoskeletal:  S/p left transmetatarsal amputation Skin:  Intact  LABS:  CBC  Recent Labs Lab 04/24/2014 0421 04/20/14  WBC 18.5* 19.6*  HGB 15.3 13.1  HCT 42.5 37.0*  PLT 210 194   Coag's  Recent Labs Lab 04/06/2014 0425 04/20/14  APTT 33  --   INR 1.63* 1.66*   BMET  Recent Labs Lab 03/29/2014 0421 04/20/14  NA 135 138  K 4.5 4.3  CL 103 104  CO2 20 25  BUN 17 31*  CREATININE 1.08 1.14  GLUCOSE 420* 298*   Electrolytes  Recent Labs Lab 04/05/2014 0421 04/16/2014 1250 04/20/14 04/20/14 1205  CALCIUM 8.9  --  8.9  --   MG  --  1.8 1.7 2.5  PHOS  --  1.3* 2.5 2.8   Sepsis Markers  Recent Labs Lab 04/03/2014 0430 04/03/2014 0652  LATICACIDVEN 2.77* 2.0   ABG  Recent Labs Lab 04/07/2014 0505 04/16/2014 1319 04/20/14 1119  PHART 7.415 7.470* 7.428  PCO2ART 30.5* 28.6* 35.2  PO2ART 121.0* 65.0* 108.0*   Liver Enzymes  Recent Labs Lab 04/07/2014 0421 04/20/14  AST 16 15  ALT 13 13  ALKPHOS 163* 117  BILITOT 0.9 0.8  ALBUMIN 2.8* 2.4*   Cardiac Enzymes  Recent Labs Lab 04/06/2014 1250 04/20/2014 1845 04/20/14  TROPONINI 0.14* 0.07* 0.16*   Glucose  Recent Labs Lab 04/20/14 1604 04/20/14 1739 04/20/14 1901 04/20/14 1943 04/20/14  2101 04/20/14 2206  GLUCAP 134* 133* 147* 158* 171* 212*    Imaging Mr Brain Wo Contrast  04/20/2014   CLINICAL DATA:  Sepsis. Altered mental status. History of GBM. Initial encounter.  EXAM: MRI HEAD WITHOUT CONTRAST  TECHNIQUE: Multiplanar, multiecho pulse sequences of the brain and surrounding structures were obtained without intravenous contrast.  COMPARISON:  None.  FINDINGS: Study was ordered without contrast. Sensitivity in the detection of residual or recurrent disease is limited in the absence of IV gadolinium.  The patient was unable to remain motionless for the exam. Small or subtle lesions could be overlooked.  Foci of restricted diffusion in the RIGHT  hemisphere correspond roughly to the posterior limb internal capsule and corticospinal tract, RIGHT MCA lenticulostriate territory. No definite acute hemorrhage, mass lesion, or extra-axial fluid.  Premature for age cerebral and cerebellar atrophy. Status post RIGHT temporal craniectomy, with extensive anterior temporal lobectomy. Fluid-filled cavity and area of encephalomalacia. No regional vasogenic edema.  Widespread confluent subcortical and periventricular white matter primarily supratentorial in location, likely post treatment effect from pole hit radiation. Scattered areas of chronic lacunar infarction versus prominent perivascular spaces are seen in the RIGHT lentiform nucleus and RIGHT thalamus.  Flow voids are maintained in the internal carotid arteries, basilar artery, both vertebral arteries, and the proximal M1 MCA segments. It is difficult to confirm or exclude disease in the distal RIGHT M1 or RIGHT MCA trifurcation branches, although flow voids appear maintained in the MCA vessels within the sylvian fissure. If further investigation is desired, regarding the intracranial circulation, MRA intracranial without contrast could be helpful in further evaluation.  Mild chronic ethmoid and maxillary sinus disease. Acute and chronic RIGHT sphenoid sinus disease. No midline abnormality. Negative orbits. Trace BILATERAL mastoid effusions.  IMPRESSION: Based on the observed noncontrast images, there are no features to strongly suggest recurrent tumor.  Acute deep white matter infarction affecting the RIGHT posterior limb internal capsule and centrum semiovale. These lie within the territory of the RIGHT MCA lenticulostriate branches.  If further investigation is desired, consider MRA intracranial for further evaluation when the patient is more cooperative, along with post infusion imaging, if no contraindications to gadolinium.   Electronically Signed   By: Rolla Flatten M.D.   On: 04/15/2014 16:30   Dg Chest  Port 1 View  04/20/2014   CLINICAL DATA:  Short of breath  EXAM: PORTABLE CHEST - 1 VIEW  COMPARISON:  Yesterday  FINDINGS: Endotracheal and NG tubes are stable. Increasing consolidation at the right lung base. Minimal hazy opacity at the left base. No pneumothorax.  IMPRESSION: Increasing airspace disease at the right base. Minimal hazy airspace disease persists at the left base. Stable endotracheal tube.   Electronically Signed   By: Marybelle Killings M.D.   On: 04/20/2014 07:32   Dg Chest Port 1 View  03/29/2014   CLINICAL DATA:  Intubation.  EXAM: PORTABLE CHEST - 1 VIEW  COMPARISON:  04/24/2014  FINDINGS: Endotracheal tube is approximately 7 cm above the carina. NG tube is in the stomach. Patchy airspace disease in the right lower lobe is similar to prior study. Left lung is clear. No effusions. Heart is normal size. Prior median sternotomy.  IMPRESSION: Endotracheal tube approximately 7 cm above the carina.  Continued patchy right lower lobe airspace disease.   Electronically Signed   By: Rolm Baptise M.D.   On: 04/10/2014 12:37   Dg Abd Portable 1v  04/15/2014   CLINICAL DATA:  Nasogastric catheter repositioning  EXAM:  PORTABLE ABDOMEN - 1 VIEW  COMPARISON:  Film from earlier in the same day  FINDINGS: The nasogastric catheter is been withdrawn slightly and on coiled within the stomach. It lies in the proximal portion of the stomach. Scattered large and small bowel gas is noted without obstructive pattern. No other focal abnormality is seen.  IMPRESSION: Nasogastric catheter within the stomach proximally. The previously seen loop has been reduced.   Electronically Signed   By: Inez Catalina M.D.   On: 04/23/2014 08:30   Dg Abd Portable 1v  04/06/2014   CLINICAL DATA:  NG tube placement.  EXAM: PORTABLE ABDOMEN - 1 VIEW  COMPARISON:  Chest radiograph -earlier same day  FINDINGS: Enteric tube tip and side port projects over the gastric fundus.  Paucity of bowel gas without definite evidence of obstruction.   No supine evidence of pneumoperitoneum, though note, the right upper abdominal quadrant is excluded from view.  Limited visualization of lower thorax demonstrates sequela of prior median sternotomy and potential minimal right basilar opacities, incompletely evaluated.  No acute osseus abnormalities.  IMPRESSION: Enteric tube tip and side port projects over the gastric fundus.   Electronically Signed   By: Sandi Mariscal M.D.   On: 04/02/2014 08:13   US Abdomen Limited Ruq  04/20/2014   CLINICAL DATA:  Fever, altered mental status, sepsis, elevated white count.  EXAM: US ABDOMEN LIMITED - RIGHT UPPER QUADRANT  COMPARISON:  None.  FINDINGS: Gallbladder:  Echogenic shadowing gallstones noted within the gallbladder. Wall thickness measures 2 mm. No pericholecystic fluid. Difficult to assess for a Percell Miller sign because the patient is ventilated.  Common bile duct:  Diameter: 4.1 mm  Liver:  No focal lesion identified. Within normal limits in parenchymal echogenicity. Patent portal vein with normal hepatopetal flow.  No free fluid in the upper abdomen. Included views of the right kidney demonstrate no hydronephrosis. Right pleural effusion evident.  IMPRESSION: Cholelithiasis.  No other acute finding or biliary dilatation.  Right pleural effusion.   Electronically Signed   By: Jerilynn Mages.  Shick M.D.   On: 04/20/2014 23:17    EKG: Sinus tachycardia CXR: RLL infiltrate consistent with PNA  ASSESSMENT / PLAN:  Active Problems:   Sepsis   Malnutrition of moderate degree   Endotracheally intubated   Left-sided weakness   Nasogastric tube fed newborn   Coagulopathy   PULMONARY A: Hypoxia Respiratory Failure requiring mechanical ventilation Pneumonia P:   Wean cpap 5 ps 5-10 Will ned to discuss goals with family Allow pos balance Treat sepsis and follow neuro resolution  CARDIOVASCULAR A: Sinus tachycarida Hx of CAD on plavix P:   plavix Tele Pos balance  RENAL A: Severe sepsis P:   Pos to even  balance Chem in am  Reduce fluids  GASTROINTESTINAL A: Need for enteral feeds Severe malnutrition R/o underlying liver dz P:   TF, pepup US done abdo  HEMATOLOGIC A: Apparent need for antiplatelet therapy Leukocytosis coagulapthy ( cause unknown) - sepsis, liver>>? P:   SCD Treat sepsis No reversal planned Repeat coags again in am   INFECTIOUS A: Severe sepsis due to Pneumonia Probable aspiration - worsnening R tbase source Staph bacteremia- coag neg staph ( ?pathogen) P:   2/22 cefepime>>>2/23 2/23 ceftriaxone>>>  2/22 azithro>>>2/23 2/22 vanc>>>2/24 MRSA PCR positive  BC 2/22>>> staph bacteremia coag neg staph (contam?) Sputum 2/22>>> Infectious disease consulted Repeat BC Follow rt base Dc vanc  ENDOCRINE A: DM, insulin dependent P: ICU insulin protocol Hope to reduce needs, attempt transition  lantus   NEUROLOGIC A: Encephalopathy ?Seizure disorder R/o new CVA (weakness), r/o astrocytoma progression 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 Found to have staph bacteremia  Cordelia Poche, MD PGY-2, [facility] 04/21/2014, 6:52 AM   STAFF NOTE: I, Merrie Roof, MD FACP have personally reviewed patient's available data, including medical history, events of note, physical examination and test results as part of my evaluation. I have discussed with resident/NP and other care providers such as pharmacist, RN and RRT. In addition, I personally evaluated patient and elicited key findings FB:[id number] coag neg staph contaminant, repeat BC, no hardware in his body known, wean attempts, prognosis is poor, will call pall care for goals, continued ceftraixone, dc  vanc The patient is critically ill with multiple organ systems failure and requires high complexity decision making for assessment and support, frequent evaluation and titration of therapies, application of advanced monitoring technologies and extensive interpretation of multiple databases.   Critical Care Time devoted to patient care services described in this note is30 Minutes. This time reflects time of care of this signee: Merrie Roof, MD FACP. This critical care time does not reflect procedure time, or teaching time or supervisory time of PA/NP/Med student/Med Resident etc but could involve care discussion time. Rest per NP/medical resident whose note is outlined above and that I agree with   Lavon Paganini. Titus Mould, MD, [place] Pgr: [facility] 04/21/2014 9:30 AM

## 2014-04-21 NOTE — Progress Notes (Signed)
Nutrition Brief Note  Chart reviewed. Pt now transitioning to comfort care.  TF has been discontinued. No further nutrition interventions warranted at this time.  Please re-consult as needed.   Kimberly Harris, RD, LDN, CNSC Pager [phone] After Hours Pager [phone]    

## 2014-04-21 NOTE — Progress Notes (Signed)
Patient ID: Dylan Rogers, male   DOB: 1950/08/26, 64 y.o.   MRN: [id number]  I have had extensive discussions with family . We discussed patients current circumstances and organ failures. We also discussed patient's prior wishes under circumstances such as this. Family has decided to NOT perform resuscitation if arrest. NO escallation  Extensive discussion with family wife. We discussed the poor prognosis and likely poor quality of life. Family has decided to offer full comfort care. They are aware that the patient may be transferred to palliative care floor for continued comfort care needs. They have been fully updated on the process and expectations.  Lavon Paganini. Titus Mould, MD, [place] Pgr: [facility]

## 2014-04-21 NOTE — Progress Notes (Signed)
STROKE TEAM PROGRESS NOTE   HISTORY Dylan Rogers is a 64 y.o. 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 History from wife who states patient had decreased oral intake over the last week and also about 1 week ago was leaning to the left while walking.  Prior to this he was able to walk with a cane but did have residual weakness on the left from previous brain surgery.  He has a h/o seizures as well with last seizure 03/05/14 and he has had 3 seizures from November until now.  Prodrome to his seizures is twitching of the right eye and wife states he was compliant with seizure medications (Dilantin 400 mg and Keppra 3000 mg) His wife states he does have word finding difficultly since having the resection of the tumor and intermittent confusion.  He was not independent at home and needed help dressing. 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   He presented with acute encephalopathy and likely sepsis 2/2 pneumonia on 2/22    Patient was not administered TPA. He was admitted to the ICU on 2/22 for further evaluation and treatment.   SUBJECTIVE (INTERVAL HISTORY) His wife is at the bedside and other family today.  Overall patient is sedated (last Fentanyl bolus was this am), not following commands still. He appears ill.    OBJECTIVE Temp:  [98.7 F (37.1 C)-100.9 F (38.3 C)] 100.9 F (38.3 C) (02/24 0738) Pulse Rate:  [29-115] 115 (02/24 0941) Cardiac Rhythm:  [-] Sinus tachycardia (02/24 0600) Resp:  [19-27] 26 (02/24 0941) BP: (105-163)/(52-85) 135/59 mmHg (02/24 0941) SpO2:  [96 %-100 %] 100 % (02/24 0941) FiO2 (%):  [40 %] 40 % (02/24 0941) Weight:  [179 lb 3.7 oz (81.3 kg)] 179 lb 3.7 oz (81.3 kg) (02/24  0311)   Recent Labs Lab 04/20/14 1739 04/20/14 1901 04/20/14 1943 04/20/14 2101 04/20/14 2206  GLUCAP 133* 147* 158* 171* 212*    Recent Labs Lab 03/30/2014 0421 04/11/2014 1250 04/20/14 04/20/14 1205 04/21/14 0644  NA 135  --  138  --  140  K 4.5  --  4.3  --  3.7  CL 103  --  104  --  109  CO2 20  --  25  --  23  GLUCOSE 420*  --  298*  --  276*  BUN 17  --  31*  --  34*  CREATININE 1.08  --  1.14  --  1.00  CALCIUM 8.9  --  8.9  --  8.4  MG  --  1.8 1.7 2.5  --   PHOS  --  1.3* 2.5 2.8  --     Recent Labs Lab 04/13/2014 0421 04/20/14  AST 16 15  ALT 13 13  ALKPHOS 163* 117  BILITOT 0.9 0.8  PROT 7.3 6.4  ALBUMIN 2.8* 2.4*    Recent Labs Lab 04/08/2014 0421 04/20/14 04/21/14 0644  WBC 18.5* 19.6* 18.1*  NEUTROABS 16.7*  --   --   HGB 15.3 13.1 11.4*  HCT 42.5 37.0* 33.4*  MCV 90.0 91.1 92.5  PLT 210 194 219    Recent Labs Lab 04/24/2014 0425 04/22/2014 1250 04/24/2014 1845 04/20/14  TROPONINI 0.35* 0.14* 0.07* 0.16*  Recent Labs  04/16/2014 0425 04/20/14  LABPROT 19.5* 19.8*  INR 1.63* 1.66*    Recent Labs  04/13/2014 0612  COLORURINE YELLOW  LABSPEC >1.030*  PHURINE 6.0  GLUCOSEU >1000*  HGBUR LARGE*  BILIRUBINUR SMALL*  KETONESUR 15*  PROTEINUR >300*  UROBILINOGEN 1.0  NITRITE NEGATIVE  LEUKOCYTESUR NEGATIVE   Mr Brain Wo Contrast  04/24/2014   CLINICAL DATA:  Sepsis. Altered mental status. History of GBM. Initial encounter.  EXAM: MRI HEAD WITHOUT CONTRAST  TECHNIQUE: Multiplanar, multiecho pulse sequences of the brain and surrounding structures were obtained without intravenous contrast.  COMPARISON:  None.  FINDINGS: Study was ordered without contrast. Sensitivity in the detection of residual or recurrent disease is limited in the absence of IV gadolinium.  The patient was unable to remain motionless for the exam. Small or subtle lesions could be overlooked.  Foci of restricted diffusion in the RIGHT hemisphere correspond roughly to the  posterior limb internal capsule and corticospinal tract, RIGHT MCA lenticulostriate territory. No definite acute hemorrhage, mass lesion, or extra-axial fluid.  Premature for age cerebral and cerebellar atrophy. Status post RIGHT temporal craniectomy, with extensive anterior temporal lobectomy. Fluid-filled cavity and area of encephalomalacia. No regional vasogenic edema.  Widespread confluent subcortical and periventricular white matter primarily supratentorial in location, likely post treatment effect from pole hit radiation. Scattered areas of chronic lacunar infarction versus prominent perivascular spaces are seen in the RIGHT lentiform nucleus and RIGHT thalamus.  Flow voids are maintained in the internal carotid arteries, basilar artery, both vertebral arteries, and the proximal M1 MCA segments. It is difficult to confirm or exclude disease in the distal RIGHT M1 or RIGHT MCA trifurcation branches, although flow voids appear maintained in the MCA vessels within the sylvian fissure. If further investigation is desired, regarding the intracranial circulation, MRA intracranial without contrast could be helpful in further evaluation.  Mild chronic ethmoid and maxillary sinus disease. Acute and chronic RIGHT sphenoid sinus disease. No midline abnormality. Negative orbits. Trace BILATERAL mastoid effusions.  IMPRESSION: Based on the observed noncontrast images, there are no features to strongly suggest recurrent tumor.  Acute deep white matter infarction affecting the RIGHT posterior limb internal capsule and centrum semiovale. These lie within the territory of the RIGHT MCA lenticulostriate branches.  If further investigation is desired, consider MRA intracranial for further evaluation when the patient is more cooperative, along with post infusion imaging, if no contraindications to gadolinium.   Electronically Signed   By: Rolla Flatten M.D.   On: 04/15/2014 16:30   Dg Chest Port 1 View  04/20/2014   CLINICAL  DATA:  Short of breath  EXAM: PORTABLE CHEST - 1 VIEW  COMPARISON:  Yesterday  FINDINGS: Endotracheal and NG tubes are stable. Increasing consolidation at the right lung base. Minimal hazy opacity at the left base. No pneumothorax.  IMPRESSION: Increasing airspace disease at the right base. Minimal hazy airspace disease persists at the left base. Stable endotracheal tube.   Electronically Signed   By: Marybelle Killings M.D.   On: 04/20/2014 07:32   Dg Chest Port 1 View  04/10/2014   CLINICAL DATA:  Intubation.  EXAM: PORTABLE CHEST - 1 VIEW  COMPARISON:  04/07/2014  FINDINGS: Endotracheal tube is approximately 7 cm above the carina. NG tube is in the stomach. Patchy airspace disease in the right lower lobe is similar to prior study. Left lung is clear. No effusions. Heart is normal size. Prior median sternotomy.  IMPRESSION: Endotracheal tube approximately 7 cm above the carina.  Continued patchy right lower lobe airspace disease.   Electronically Signed   By: Rolm Baptise M.D.   On: 04/15/2014 12:37   US Abdomen Limited Ruq  04/20/2014   CLINICAL DATA:  Fever, altered mental status, sepsis, elevated white count.  EXAM: US ABDOMEN LIMITED - RIGHT UPPER QUADRANT  COMPARISON:  None.  FINDINGS: Gallbladder:  Echogenic shadowing gallstones noted within the gallbladder. Wall thickness measures 2 mm. No pericholecystic fluid. Difficult to assess for a Percell Miller sign because the patient is ventilated.  Common bile duct:  Diameter: 4.1 mm  Liver:  No focal lesion identified. Within normal limits in parenchymal echogenicity. Patent portal vein with normal hepatopetal flow.  No free fluid in the upper abdomen. Included views of the right kidney demonstrate no hydronephrosis. Right pleural effusion evident.  IMPRESSION: Cholelithiasis.  No other acute finding or biliary dilatation.  Right pleural effusion.   Electronically Signed   By: Jerilynn Mages.  Shick M.D.   On: 04/20/2014 23:17     PHYSICAL EXAM General: frail middle aged  Caucasian male who is sedated and intubated Mental Status: Encephalopathic, eyes closed, not following commands or w/d pain all 4 ext  Cranial Nerves: II: pupils constricted though equal, round, reactive to light and accommodation III,IV, UY:[id number] to assess due to mental status V,VII: unable to assess due to mental status VIII: unable to assess due to mental status IX,X: gag reflex present XI: unable to assess due to mental status XII: unable to assess due to mental status  Motor: Able to maintain tone in right upper and lower extremities though LLE and LUE with mild weakness  Tone and bulk:normal tone throughout; no atrophy noted  Sensory: unable to assess due to mental status Deep Tendon Reflexes:  Not assessed   Plantars: Right: downgoing   Left: downgoing Cerebellar: unable to assess due to mental status Gait: unable to assess due to mental status CV: pulses palpable throughout  Ext: :Left foot with transmetatarsal amputation, multiple excoriations to extremities  ASSESSMENT/PLAN Mr. Dylan Rogers is a 64 y.o. male with history of uncontrolled DM, seizures, s/p GBM resection presenting with acute encephalopathy with coagulase negative staph 1/2 blood cultures. He did not receive IV t-PA.   Acute encephalopathy and acute deep white matter infarction (right posterior limb internal capsule and centrum semiovale) likely 2/2 small vessel disease in setting of dehydration   Resultant  Left hemiparesis, encephalopathy likely related to underlying infection, metabolic issues  MRI  2/59 Acute deep white matter infarction affecting the RIGHT posterior limb internal capsule and centrum semiovale. These lie within the territory of the RIGHT MCA lenticulostriate branches.  MRA  Not obtained   Carotid Doppler  Pending   2D Echo  2/23 mild LVH, 45-50%, grade 1 DD  EEG 2/22 generalized slowing indicating a mild to moderate cerebral disturbance (encephalopathy). Focal slowing and  intermittent sharp wave activity is noted in the right temporal region indicating a potential seizure foci  LDL 80 2/24   HgbA1c pending  Heparin for VTE prophylaxis  Tube feeds for nutrition   aspirin 81 mg orally every day and clopidogrel 75 mg orally every day prior to admission, now on aspirin 81 mg orally every day and clopidogrel 75 mg orally every day  Wife counseled to be compliant with his antithrombotic medications  Ongoing aggressive stroke risk factor management  Will not do aggressive stroke w/u. Neuro will sign off re-consult if needed   Therapy recommendations:  PT, OT, SLP when able  Disposition:  As  determined by PT, OT  Hypertension  Home meds:   Vasotec 5 mg qd, Lopressor 25 mg qd  Stable  Wife counseled to be compliant with his blood pressure medications  Hyperlipidemia  Home meds:  Lipitor 20 mg qd will need to be resumed in hospital when able  LDL 80, goal < 70  Continue statin at discharge  Diabetes  HgbA1c pending, goal < 7.0  Uncontrolled  Other Stroke Risk Factors  Advanced age  Cigarette smoker, advised to stop smoking  History ETOH use  Family hx stroke unknown  Other Active Problems  History of seizures-pending keppra and dilantin levels today.  Rec. Primary continue home medications as was taking on Keppra 1000 mg bid and Dilantin 100 qam and 200 qpm (per wife patient was taking Dilantin 400 mg total and Keppra 3000 mg total per day have pharmacy confirm doses and start medications as was taking at home); EEG done 2/22  With focal slowing and intermittent sharp wave activity is noted in the right temporal region indicating a potential seizure foci  Monitor for seizure like activity   Acute encephalopathy infectious vs metabolic vs acute right posterior limb internal capsule and centrum semiovale vs seizures (see above)  Sepsis likely 2/2 aspiration pneumonia per primary team-on abx   Other Pertinent History  Other medical  issues per primary team  Hospital day # 2  Karlyn Agee MD PGY 3 IM  This patient is critically ill and at significant risk of neurological worsening, death and care requires constant monitoring of vital signs, hemodynamics,respiratory and cardiac monitoring,review of multiple databases, neurological assessment, discussion with family, other specialists and medical decision making of high complexity.I have made any additions or clarifications directly to the above note.  I spent 30 minutes of neurocritical care time  in the care of  this patient. I have personally examined this patient, reviewed notes, independently viewed imaging studies, participated in medical decision making and plan of care. I have made any additions or clarifications directly to the above note. Agree with note above. I had a long discussion with the patient's wife at the bedside with regards to his neurological condition, ongoing multiple medical problems which trump his small stroke and answered questions.  Antony Contras, MD Medical Director [facility] Pager: [phone] 04/21/2014 1:03 PM     04/21/2014 9:51 AM    To contact Stroke Continuity provider, please refer to [email or web address] After hours, contact General Neurology

## 2014-04-22 DIAGNOSIS — J9600 Acute respiratory failure, unspecified whether with hypoxia or hypercapnia: Secondary | ICD-10-CM

## 2014-04-22 DIAGNOSIS — A419 Sepsis, unspecified organism: Secondary | ICD-10-CM

## 2014-04-22 DIAGNOSIS — D689 Coagulation defect, unspecified: Secondary | ICD-10-CM

## 2014-04-22 LAB — CULTURE, RESPIRATORY W GRAM STAIN

## 2014-04-22 LAB — HEMOGLOBIN A1C
Hgb A1c MFr Bld: 7.1 % — ABNORMAL HIGH (ref 4.8–5.6)
Mean Plasma Glucose: 157 mg/dL

## 2014-04-22 NOTE — Progress Notes (Addendum)
Report called to receiving RN, Remo Lipps.  Patient to be transferred from [place] room 3 to 6N room 10.  All questions answered.  Patient to be transferred via bed to new room locations by myself and NT.  Wife and extended family present during transfer.  Patient's chart, medications, and personal belongings sent with patient.  Safety measures maintained.    Patient arrived to 6N at 1815, Remo Lipps not available but notified of patient's arrival.  Mardene Celeste, RN saw patient before we left the unit.  HR was in the 100s before tele monitor was discontinued. , SMITH, Anastasiya Gowin A, RN

## 2014-04-22 NOTE — Procedures (Signed)
Extubation Procedure Note  Patient Details:   Name: Dylan Rogers DOB: [birth year] MRN: [id number]   Airway Documentation:  Airway (Active)  Secured at (cm) 25 cm 04/22/2014  7:54 AM  Measured From Lips 04/22/2014  8:00 AM  [place] 04/22/2014  8:00 AM  Secured By [employer] 04/22/2014  8:00 AM  Tube Holder Repositioned Yes 04/22/2014  7:54 AM  Cuff Pressure (cm H2O) 28 cm H2O 04/21/2014  8:05 PM  Site Condition Dry 04/22/2014  8:00 AM    Evaluation  O2 sats: currently acceptable Complications: No apparent complications Patient did tolerate procedure well. Bilateral Breath Sounds: Rhonchi Suctioning: Airway No   Terminal Extubation done.  Pt placed on 2L [place].    Beatris Si D 04/22/2014, 11:41 AM

## 2014-04-22 NOTE — Progress Notes (Signed)
UR Completed.  [phone]  

## 2014-04-22 NOTE — Progress Notes (Signed)
PULMONARY / CRITICAL CARE MEDICINE HISTORY AND PHYSICAL EXAMINATION   Name: Dylan Rogers MRN: [id number] DOB: 08-17-1950    ADMISSION DATE:  04/16/2014  PRIMARY SERVICE: PCCM  CHIEF COMPLAINT:  Altered Mental status  BRIEF PATIENT DESCRIPTION: 64 y/o man with recent craniotomy for tx of astrocytoma who presents w/ fever, tachypnea, and AMS  SIGNIFICANT EVENTS / STUDIES:  Intubated in ED for respiratory distress 2/22: EEG suggested encephalopathy and possible seizure focus of right temporal region 2/23: Echo with EF 45-50%, hypokinesis, LVH; US shows cholelithiasis only  LINES / TUBES: ETT 2/22>>> PIV x2 OGT 2/22>>> Foley 2/22>>>  CULTURES: Blood and urine cx obtained from OSH [facility])  ANTIBIOTICS: Cefepime Vanc Azithro  SUBJECTIVE:  GCS 3 UOP 771mL Mental status not improved Febrile to 100.9 overnight Full comfort care  VITAL SIGNS: Temp:  [100.9 F (38.3 C)] 100.9 F (38.3 C) (02/24 0738) Pulse Rate:  [92-121] 98 (02/25 0600) Resp:  [13-29] 20 (02/25 0600) BP: (115-143)/(54-73) 137/71 mmHg (02/25 0600) SpO2:  [91 %-100 %] 96 % (02/25 0600) FiO2 (%):  [40 %] 40 % (02/25 0453)  HEMODYNAMICS:   VENTILATOR SETTINGS: Vent Mode:  [-] PRVC FiO2 (%):  [40 %] 40 % Set Rate:  [16 bmp] 16 bmp Vt Set:  [560 mL] 560 mL PEEP:  [5 cmH20] 5 cmH20 Pressure Support:  [8 cmH20] 8 cmH20 Plateau Pressure:  [5 cmH20-15 cmH20] 15 cmH20  INTAKE / OUTPUT: Intake/Output      02/24 0701 - 02/25 0700   I.V. (mL/kg) 455.7 (5.6)   NG/GT 540   Total Intake(mL/kg) 995.7 (12.2)   Urine (mL/kg/hr) 760 (0.4)   Stool 775 (0.4)   Total Output 1535   Net -539.3       Stool Occurrence 1 x    PHYSICAL EXAMINATION: General:  Chronically ill appearing, intubated Neuro:  Non-sedated. Not responsive HEENT:  Evidence of prior craniotomy. Neck: No JVD Cardiovascular:  Regular rate and rhythm, no M/R/G Lungs:  Clear to auscultation on anterior auscultation Abdomen:   Soft Musculoskeletal:  S/p left transmetatarsal amputation Skin:  Intact  LABS:  CBC  Recent Labs Lab 04/12/2014 0421 04/20/14 04/21/14 0644  WBC 18.5* 19.6* 18.1*  HGB 15.3 13.1 11.4*  HCT 42.5 37.0* 33.4*  PLT 210 194 219   Coag's  Recent Labs Lab 04/11/2014 0425 04/20/14  APTT 33  --   INR 1.63* 1.66*   BMET  Recent Labs Lab 04/05/2014 0421 04/20/14 04/21/14 0644  NA 135 138 140  K 4.5 4.3 3.7  CL 103 104 109  CO2 20 25 23   BUN 17 31* 34*  CREATININE 1.08 1.14 1.00  GLUCOSE 420* 298* 276*   Electrolytes  Recent Labs Lab 04/09/2014 0421 04/13/2014 1250 04/20/14 04/20/14 1205 04/21/14 0644  CALCIUM 8.9  --  8.9  --  8.4  MG  --  1.8 1.7 2.5  --   PHOS  --  1.3* 2.5 2.8  --    Sepsis Markers  Recent Labs Lab 04/22/2014 0430 04/10/2014 0652  LATICACIDVEN 2.77* 2.0   ABG  Recent Labs Lab 04/24/2014 0505 04/15/2014 1319 04/20/14 1119  PHART 7.415 7.470* 7.428  PCO2ART 30.5* 28.6* 35.2  PO2ART 121.0* 65.0* 108.0*   Liver Enzymes  Recent Labs Lab 04/09/2014 0421 04/20/14  AST 16 15  ALT 13 13  ALKPHOS 163* 117  BILITOT 0.9 0.8  ALBUMIN 2.8* 2.4*   Cardiac Enzymes  Recent Labs Lab 04/13/2014 1250 04/23/2014 1845 04/20/14  TROPONINI 0.14* 0.07* 0.16*  Glucose  Recent Labs Lab 04/21/14 0556 04/21/14 0659 04/21/14 0805 04/21/14 0903 04/21/14 1005 04/21/14 1113  GLUCAP 241* 268* 216* 157* 165* 131*   Imaging US Abdomen Limited Ruq  04/20/2014   CLINICAL DATA:  Fever, altered mental status, sepsis, elevated white count.  EXAM: US ABDOMEN LIMITED - RIGHT UPPER QUADRANT  COMPARISON:  None.  FINDINGS: Gallbladder:  Echogenic shadowing gallstones noted within the gallbladder. Wall thickness measures 2 mm. No pericholecystic fluid. Difficult to assess for a Percell Miller sign because the patient is ventilated.  Common bile duct:  Diameter: 4.1 mm  Liver:  No focal lesion identified. Within normal limits in parenchymal echogenicity. Patent portal vein with  normal hepatopetal flow.  No free fluid in the upper abdomen. Included views of the right kidney demonstrate no hydronephrosis. Right pleural effusion evident.  IMPRESSION: Cholelithiasis.  No other acute finding or biliary dilatation.  Right pleural effusion.   Electronically Signed   By: Jerilynn Mages.  Shick M.D.   On: 04/20/2014 23:17    EKG: Sinus tachycardia CXR: RLL infiltrate consistent with PNA  ASSESSMENT / PLAN:  Active Problems:   Sepsis   Malnutrition of moderate degree   Endotracheally intubated   Left-sided weakness   Nasogastric tube fed newborn   Coagulopathy   PULMONARY A: Hypoxia Respiratory Failure requiring mechanical ventilation Pneumonia Comfort care P:   Plan for terminal extubation today. Increase morphine to 15 mg/hr then extubate.  CARDIOVASCULAR A: Sinus tachycarida Hx of CAD on plavix P:   Plavix D/C Tele once extubated  RENAL A: Severe sepsis P:   D/C further blood draws.  GASTROINTESTINAL A: Need for enteral feeds Severe malnutrition P:   Tube feeds discontinued  HEMATOLOGIC A: Apparent need for antiplatelet therapy Leukocytosis coagulapthy (cause unknown) - sepsis, liver>>? P:   SCD  INFECTIOUS A: Severe sepsis due to Pneumonia Probable aspiration - worsnening R tbase source Staph bacteremia- coag neg staph ( ?pathogen) P:   2/22 cefepime>>>2/23 2/23 ceftriaxone>>> 2/24 2/22 azithro>>>2/23 2/22 vanc>>>2/24 MRSA PCR positive  BC 2/22>>> staph bacteremia coag neg staph (contam?) BC 2/24>>> Sputum 2/22>>>  ENDOCRINE A: DM, insulin dependent P: Stop CBGs Hold insulin  NEUROLOGIC A: Encephalopathy ?Seizure disorder R/o new CVA (weakness), r/o astrocytoma progression Right internal capsule infarct ?right temporal seizure, although has prior craniotomy P:   Terminal wean Comfort care  TODAY'S SUMMARY: 64 y/o man with sepsis and altered mental status with respiratory failure requiring mechanical ventilation. Found  to have staph bacteremia. Family decided on comfort care. Terminal extubation today.  Cordelia Poche, MD PGY-2, [facility] 04/22/2014, 6:49 AM   Spoke with wife, the patient has made his wishes perfectly clear in the past that he did not want to be on life support.  I was clear with her that work up is not complete but she is right that even with recovery from this acute illness the patient is not likely to go back to her normal functional status which was poor to start with.  After discussion, she expressed understanding and requested termination of life supporting measures.  Will increase morphine and terminally extubate.  Full DNR and comfort care.  The patient is critically ill with multiple organ systems failure and requires high complexity decision making for assessment and support, frequent evaluation and titration of therapies, application of advanced monitoring technologies and extensive interpretation of multiple databases.   Critical Care Time devoted to patient care services described in this note is  35  Minutes. This time reflects  time of care of this signee Dr Jennet Maduro. This critical care time does not reflect procedure time, or teaching time or supervisory time of PA/NP/Med student/Med Resident etc but could involve care discussion time.  Rush Farmer, M.D. [facility] Pulmonary/Critical Care Medicine. Pager: [phone]. After hours pager: [phone].

## 2014-04-23 LAB — PHENYTOIN LEVEL, FREE AND TOTAL
PHENYTOIN, TOTAL: 13.9 ug/mL (ref 10.0–20.0)
Phenytoin, Free: UNDETERMINED ug/mL

## 2014-04-23 LAB — LEVETIRACETAM LEVEL: Levetiracetam Lvl: 22.8 ug/mL (ref 10.0–40.0)

## 2014-04-23 MED ORDER — LORAZEPAM 2 MG/ML IJ SOLN
2.0000 mg | INTRAMUSCULAR | Status: DC | PRN
  Administered 2014-04-23 – 2014-04-24 (×2): 2 mg via INTRAVENOUS
  Filled 2014-04-23 (×2): qty 1

## 2014-04-23 MED ORDER — ACETAMINOPHEN 650 MG RE SUPP
650.0000 mg | RECTAL | Status: DC | PRN
  Administered 2014-04-24 (×2): 650 mg via RECTAL
  Filled 2014-04-23 (×2): qty 1

## 2014-04-23 MED ORDER — ATROPINE SULFATE 1 % OP SOLN
4.0000 [drp] | OPHTHALMIC | Status: DC | PRN
  Administered 2014-04-23 – 2014-04-24 (×3): 4 [drp] via SUBLINGUAL
  Filled 2014-04-23: qty 5

## 2014-04-23 NOTE — Progress Notes (Signed)
PCCM PROGRESS NOTE  Dylan Rogers is a 64 y.o. admitted on 04/12/2014 with fever and altered mental status after recent craniotomy for astrocytoma.  He was intubated in ER.  Neurology was consulted.  He was treated for pneumonia and sepsis.  He was treated with AED's with EEG showing sharp wave activity in Rt temporal region.  Family conference held, and decision made for DNR status with transition to comfort measures.  He was extubated, and transferred to palliative care unit.  SUBJECTIVE: Appears comfortable  OBJECTIVE: Temp:  [102 F (38.9 C)] 102 F (38.9 C) (02/26 0534) Pulse Rate:  [83-109] 109 (02/26 0534) Resp:  [7-19] 12 (02/26 0534) BP: (122-131)/(59-65) 126/59 mmHg (02/26 0534) SpO2:  [92 %-100 %] 92 % (02/26 0534)  General: no distress HEENT: dry mucosa Cardiac: tachycardic Chest: shallow respirations Abd: non tender Ext: 1+ edema Neuro: comatose   ASSESSMENT: Acute respiratory failure with hypoxia Acute encephalopathy Acute Rt MCA CVA Sepsis HCAP with MSSA and Pneumococcus Staphylcoccal bacteremia Seizures Hx of Astrocytoma Acute systolic heart failure Hx of CAD Severe protein calorie malnutrition DM type II with renal complications Cholelithiasis Pleural effusion on the right  PLAN: Morphine, ativan Tylenol prn for fever Atropine gtt for respiratory secretions DNR/DNI  Updated pt's family and addressed their questions.  Chesley Mires, MD [facility] Pulmonary/Critical Care 04/23/2014, 9:48 AM Pager:  [phone] After 3pm call: [phone]

## 2014-04-24 DIAGNOSIS — J9601 Acute respiratory failure with hypoxia: Secondary | ICD-10-CM

## 2014-04-24 NOTE — Progress Notes (Signed)
Pt unresponsive, pt continues to have temp, PRN tylenol as ordered, pt turned and repositioned Q 2 hours and mouth care done Q 2 hours, pt appears comfortable, morphine gtt @ 78ml/hr,

## 2014-04-24 NOTE — Progress Notes (Signed)
PCCM PROGRESS NOTE  Dylan Rogers is a 64 y.o. admitted on 03/30/2014 with fever and altered mental status after recent craniotomy for astrocytoma.  He was intubated in ER.  Neurology was consulted.  He was treated for pneumonia and sepsis.  He was treated with AED's with EEG showing sharp wave activity in Rt temporal region.  Family conference held, and decision made for DNR status with transition to comfort measures.  He was extubated, and transferred to palliative care unit.  SUBJECTIVE: Family at bedside, appears very comfortable but with slowing respirations.   OBJECTIVE: Temp:  [102.9 F (39.4 C)] 102.9 F (39.4 C) (02/27 0609) Pulse Rate:  [112] 112 (02/27 0609) Resp:  [17] 17 (02/27 0609) BP: (115)/(51) 115/51 mmHg (02/27 0609) SpO2:  [90 %] 90 % (02/27 0609)  General: no distress  ASSESSMENT: Acute respiratory failure with hypoxia Acute encephalopathy Acute Rt MCA CVA Sepsis HCAP with MSSA and Pneumococcus Staphylcoccal bacteremia Seizures Hx of Astrocytoma Acute systolic heart failure Hx of CAD Severe protein calorie malnutrition DM type II with renal complications Cholelithiasis Pleural effusion on the right  PLAN: Comfort care measures.

## 2014-04-25 LAB — CULTURE, BLOOD (ROUTINE X 2): CULTURE: NO GROWTH

## 2014-04-27 NOTE — Progress Notes (Signed)
Pt died at 64 pronounced by me and Ivor Costa, RN, Dr. Rogue Bussing on call MD and gave the death cert, family at the bedside, informed the [place] donor, transfer the body to the morgue, all personal belongings given to the family.

## 2014-04-27 NOTE — Progress Notes (Signed)
Pt morphine drip wasted with witness Ivor Costa, RN with 240 ml wasted in the sink.

## 2014-04-27 NOTE — Discharge Summary (Signed)
Dictation #: [id number]

## 2014-04-27 DEATH — deceased

## 2014-04-28 NOTE — Discharge Summary (Signed)
NAMEMarland Kitchen  Dylan Rogers, Dylan Rogers NO.:  [id number]  MEDICAL RECORD NO.:  [id number]  LOCATION:  [id number]                        FACILITY:  [facility]  PHYSICIAN:  Kathee Delton, MD,FCCPDATE OF BIRTH:  02-19[birth year]1949  DATE OF ADMISSION:  04/10/2014 DATE OF DISCHARGE:  May 20, 2014                              DISCHARGE SUMMARY   DEATH SUMMARY.  DATE OF DEATH:  2014-05-20.  HISTORY OF PRESENT ILLNESS:  Please see dictated H and P at the time of admission.  HOSPITAL COURSE:  The patient was admitted with fever and altered mental status after his recent craniotomy for an astrocytoma.  He had acute respiratory failure and required endotracheal intubation in the emergency room.  He was treated aggressively for pneumonia and sepsis, but continues to do poorly with poor return of mentation.  The critical care services had a family conference with the family and it was decided to make him a do not resuscitate and the transition him to comfort measures.  He was subsequently extubated and transferred to the palliative care unit where he was treated with a morphine drip.  The patient ultimately expired on 05-20-14 with the family at his bedside.     Kathee Delton, MD,FCCP     [facility]  D:  04/27/2014  T:  04/28/2014  Job:  [id number]

## 2016-03-01 IMAGING — MR MR HEAD W/O CM
8 of 9 series · 37 of 48 positions shown · non-contrast
Comparison: None.

CLINICAL DATA: Sepsis. Altered mental status. History of GBM.
Initial encounter.

EXAM:
MRI HEAD WITHOUT CONTRAST
TECHNIQUE: Multiplanar, multiecho pulse sequences of the brain and surrounding
structures were obtained without intravenous contrast.

[Series 4: DWI · axial · 3.0mm · 1.09mm/px · z∈[-21,+122]mm · 9 of 98 slices shown (1 of 4)]
[im 1/98]
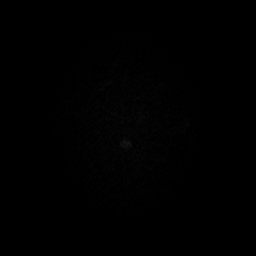
[im 17/98]
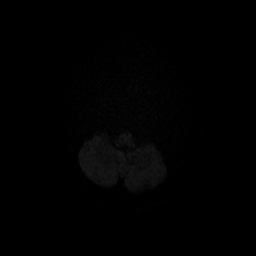
[im 33/98]
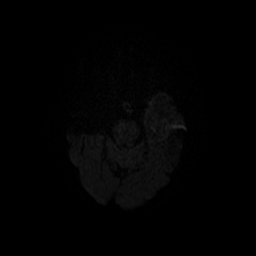
[im 41/98]
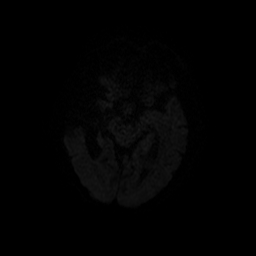
[im 49/98]
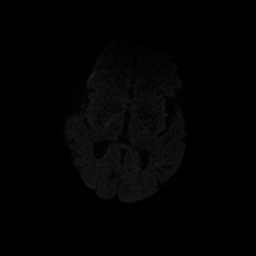
[im 57/98]
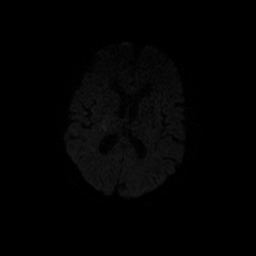
[im 65/98]
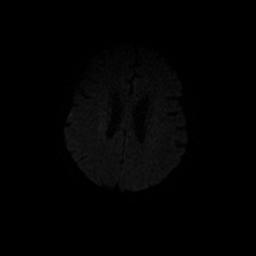
[im 81/98]
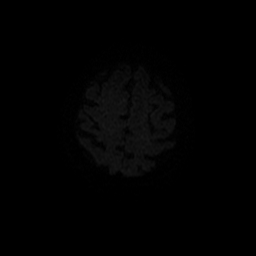
[im 98/98]
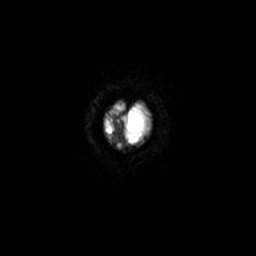

[Series 5: FLAIR · axial · 5.0mm · 0.43mm/px · z∈[-30,+112]mm · 3 of 25 slices shown (1 of 3)]
[im 1/25]
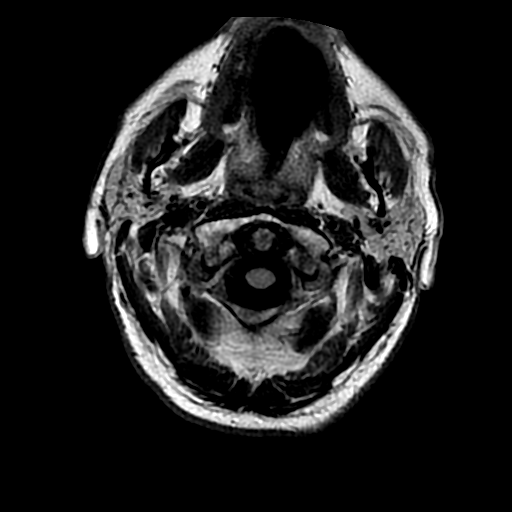
[im 13/25]
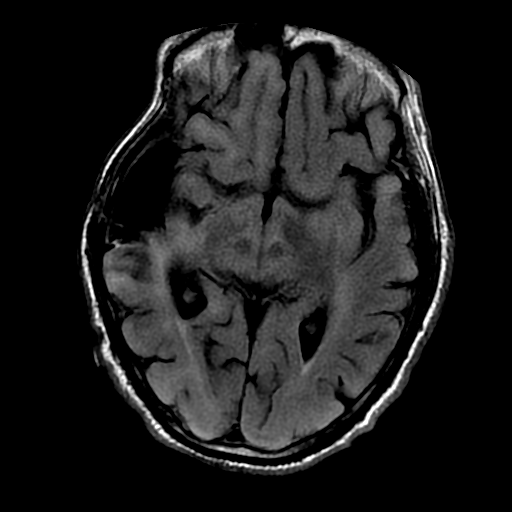
[im 25/25]
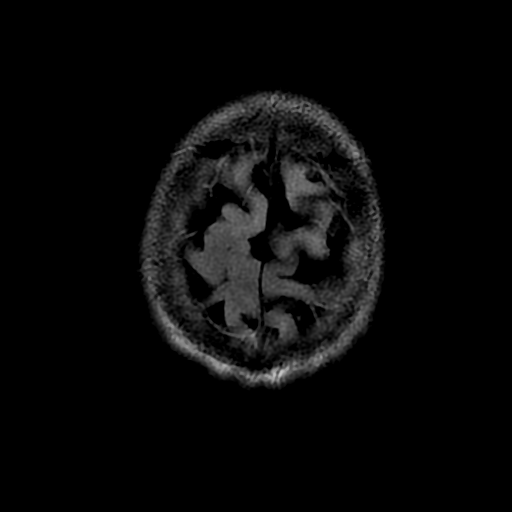

[Series 6: FLAIR · axial · 5.0mm · 0.43mm/px · z∈[-26,+123]mm · 3 of 26 slices shown (2 of 3)]
[im 1/26]
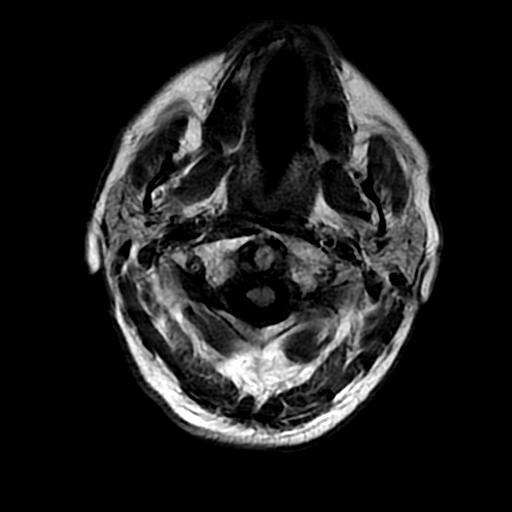
[im 13/26]
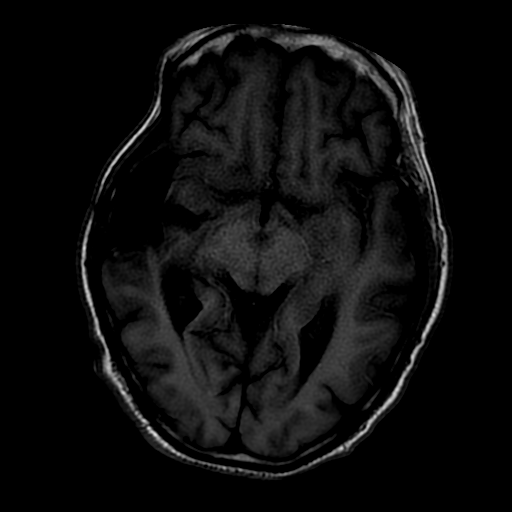
[im 26/26]
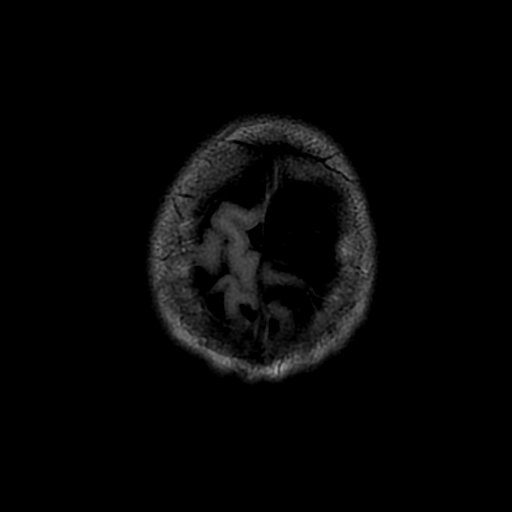

[Series 7: FLAIR · sagittal · 5.0mm · 0.47mm/px · 3 of 23 slices shown (3 of 3)]
[im 1/23]
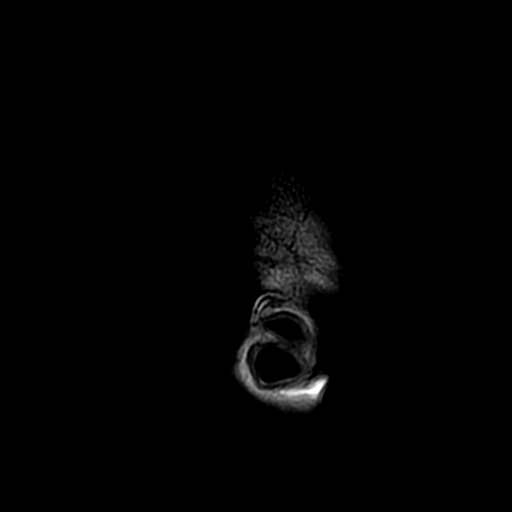
[im 12/23]
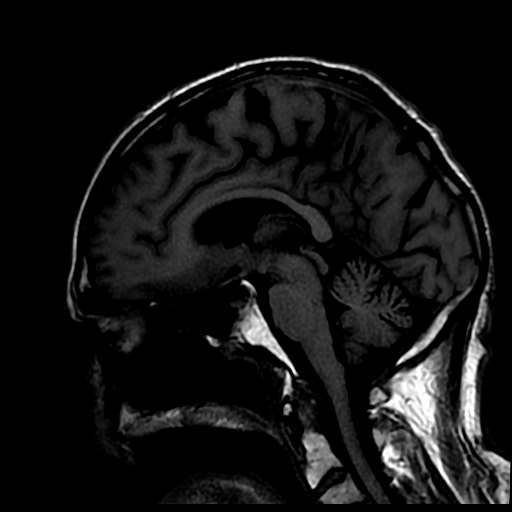
[im 23/23]
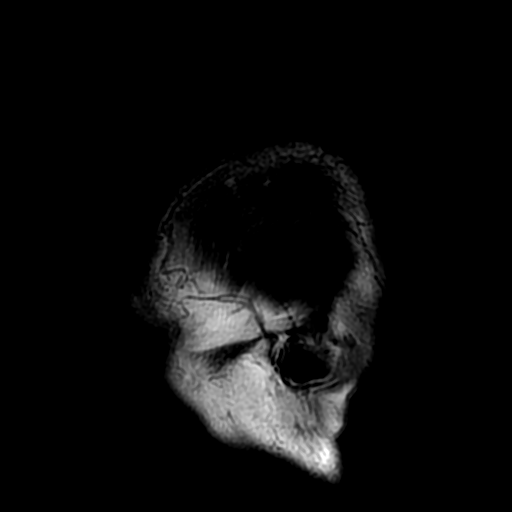

[Series 8: T2 · coronal · 5.0mm · 0.43mm/px · 1 of 30 slices shown]
[im 1/30]
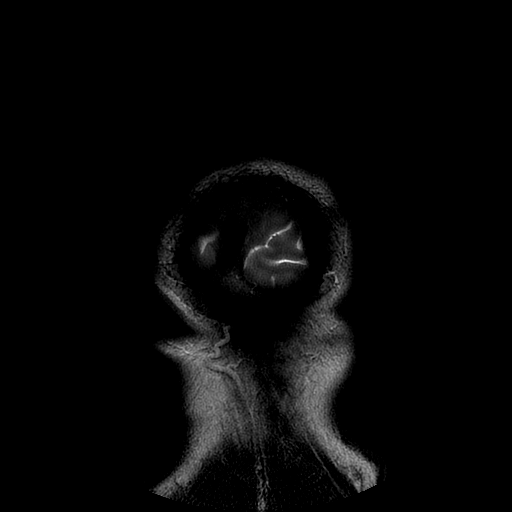

[Series 9: DWI · coronal · 5.0mm · 1.09mm/px · 8 of 72 slices shown (2 of 4)]
[im 1/72]
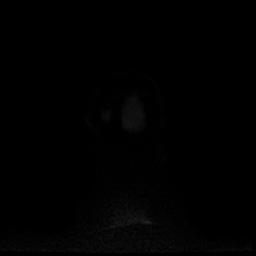
[im 9/72]
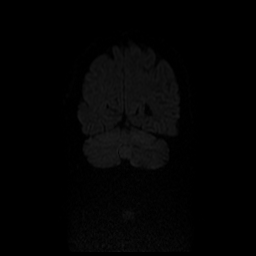
[im 18/72]
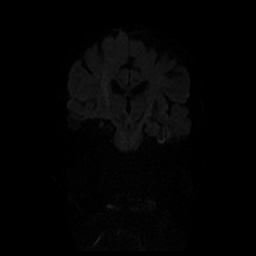
[im 27/72]
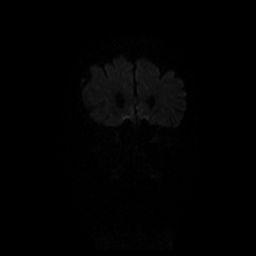
[im 45/72]
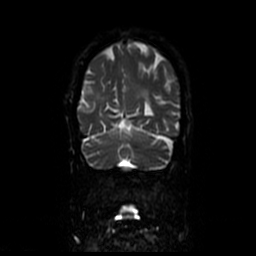
[im 54/72]
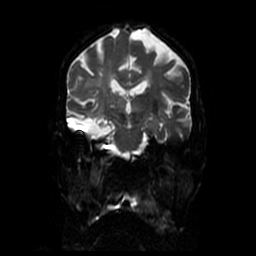
[im 63/72]
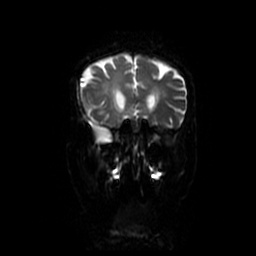
[im 72/72]
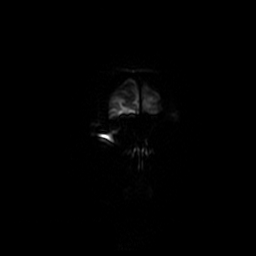

[Series 400: DWI · axial · 3.0mm · 1.09mm/px · z∈[-21,+122]mm · 6 of 49 slices shown (3 of 4)]
[im 1/49]
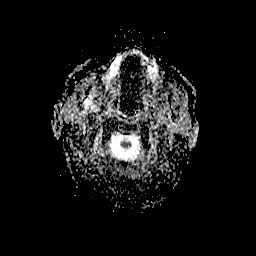
[im 10/49]
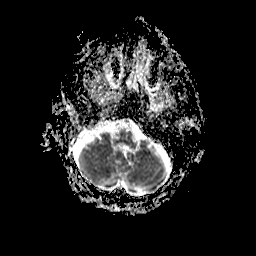
[im 20/49]
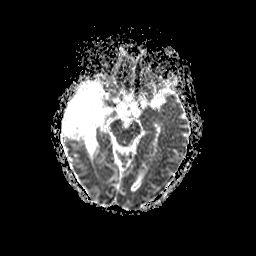
[im 29/49]
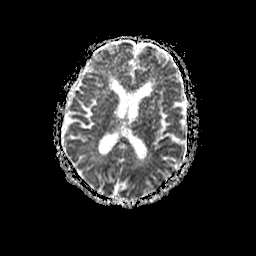
[im 39/49]
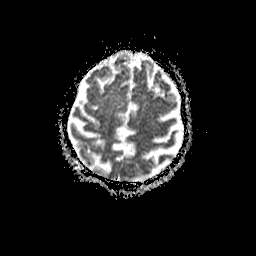
[im 49/49]
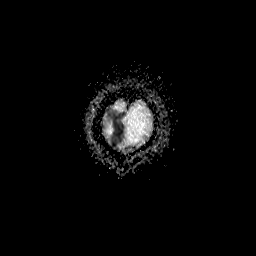

[Series 900: DWI · coronal · 5.0mm · 1.09mm/px · 4 of 36 slices shown (4 of 4)]
[im 1/36]
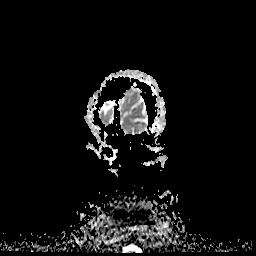
[im 12/36]
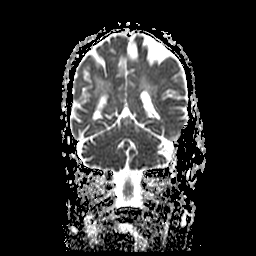
[im 24/36]
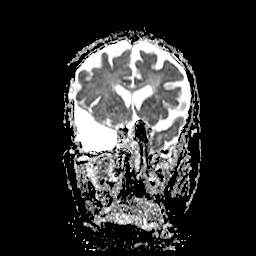
[im 36/36]
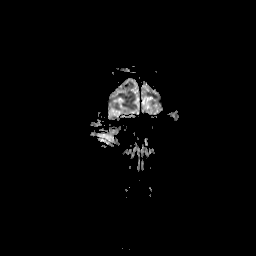

[37 of 48 positions shown; findings below may reference images not displayed]

FINDINGS: Study was ordered without contrast. Sensitivity in the detection of
residual or recurrent disease is limited in the absence of IV
gadolinium.

The patient was unable to remain motionless for the exam. Small or
subtle lesions could be overlooked.

Foci of restricted diffusion in the RIGHT hemisphere correspond
roughly to the posterior limb internal capsule and corticospinal
tract, RIGHT MCA lenticulostriate territory. No definite acute
hemorrhage, mass lesion, or extra-axial fluid.

Premature for age cerebral and cerebellar atrophy. Status post RIGHT
temporal craniectomy, with extensive anterior temporal lobectomy.
Fluid-filled cavity and area of encephalomalacia. No regional
vasogenic edema.

Widespread confluent subcortical and periventricular white matter
primarily supratentorial in location, likely post treatment effect
from pole hit radiation. Scattered areas of chronic lacunar
infarction versus prominent perivascular spaces are seen in the
RIGHT lentiform nucleus and RIGHT thalamus.

Flow voids are maintained in the internal carotid arteries, basilar
artery, both vertebral arteries, and the proximal M1 MCA segments.
It is difficult to confirm or exclude disease in the distal RIGHT M1
or RIGHT MCA trifurcation branches, although flow voids appear
maintained in the MCA vessels within the sylvian fissure. If further
investigation is desired, regarding the intracranial circulation,
MRA intracranial without contrast could be helpful in further
evaluation.

Mild chronic ethmoid and maxillary sinus disease. Acute and chronic
RIGHT sphenoid sinus disease. No midline abnormality. Negative
orbits. Trace BILATERAL mastoid effusions.
IMPRESSION: Based on the observed noncontrast images, there are no features to
strongly suggest recurrent tumor.

Acute deep white matter infarction affecting the RIGHT posterior
limb internal capsule and centrum semiovale. These lie within the
territory of the RIGHT MCA lenticulostriate branches.

If further investigation is desired, consider MRA intracranial for
further evaluation when the patient is more cooperative, along with
post infusion imaging, if no contraindications to gadolinium.

## 2016-03-02 IMAGING — US US ABDOMEN LIMITED
1 series · 14 of 25 positions shown · non-contrast
Comparison: None.

CLINICAL DATA: Fever, altered mental status, sepsis, elevated white
count.

EXAM:
US ABDOMEN LIMITED - RIGHT UPPER QUADRANT

[Series 1: us abdomen limited · 0.27mm/px · 14 of 41 slices shown]
[im 1/41]
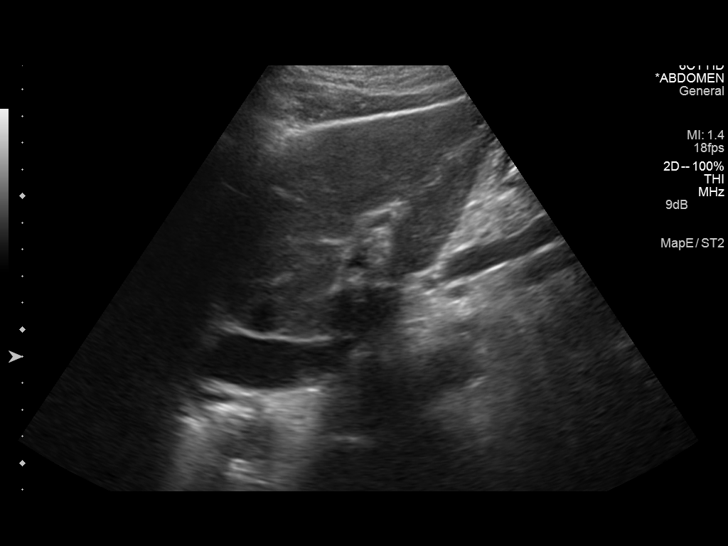
[im 4/41]
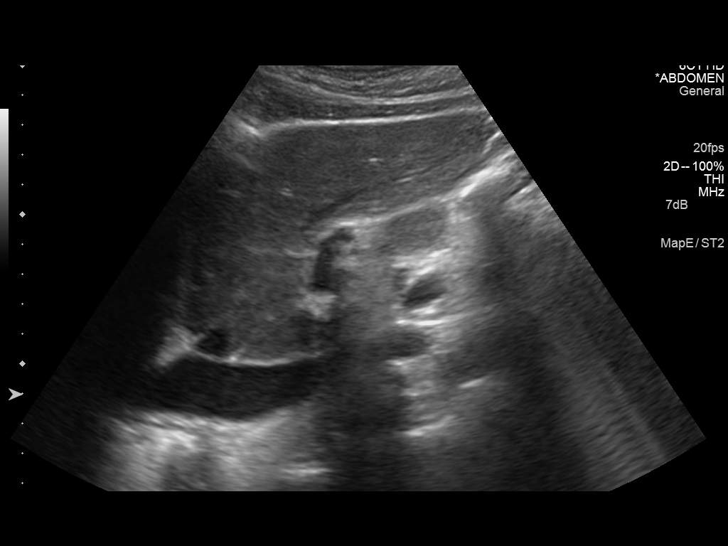
[im 7/41]
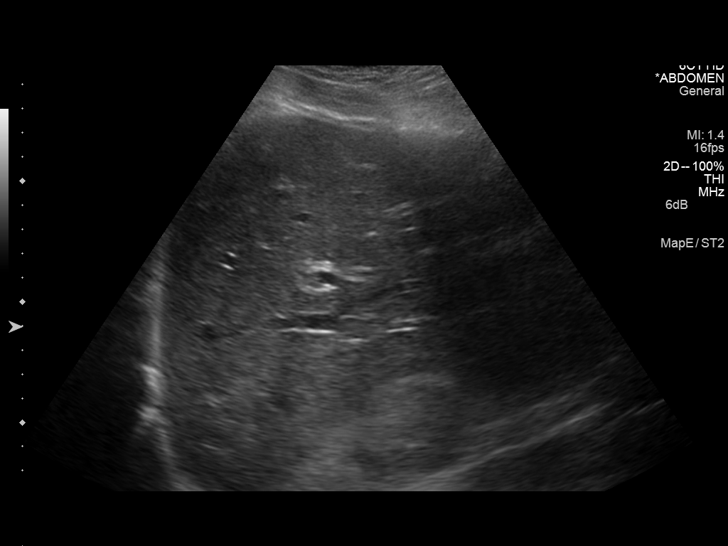
[im 11/41]
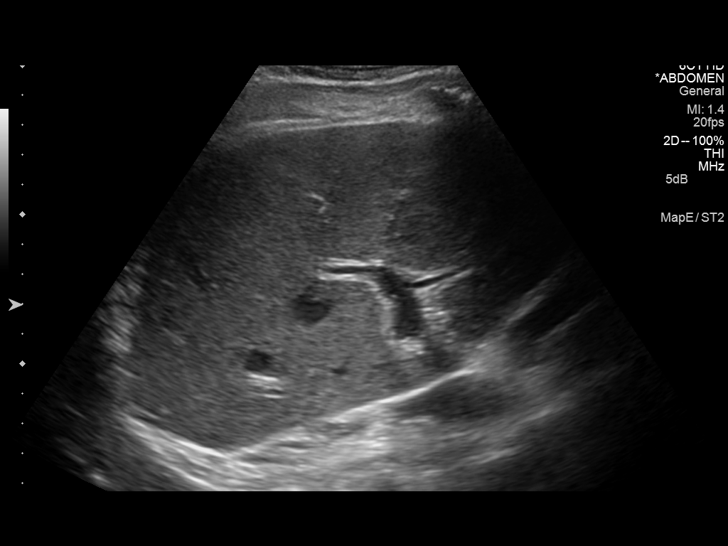
[im 14/41]
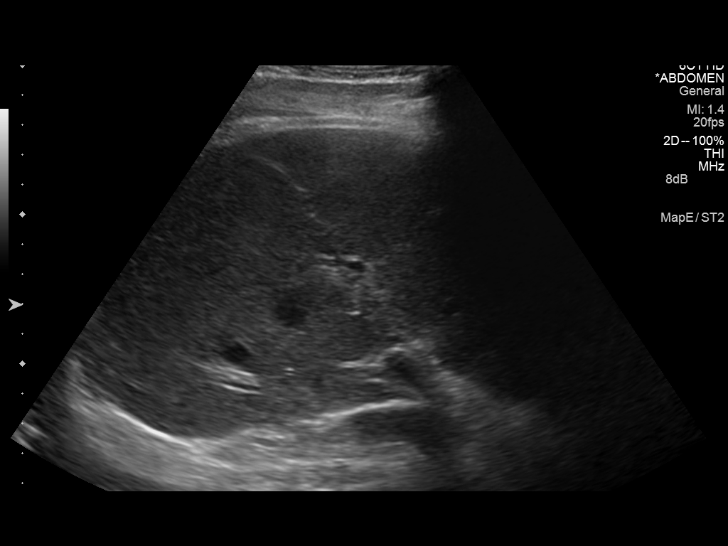
[im 16/41]
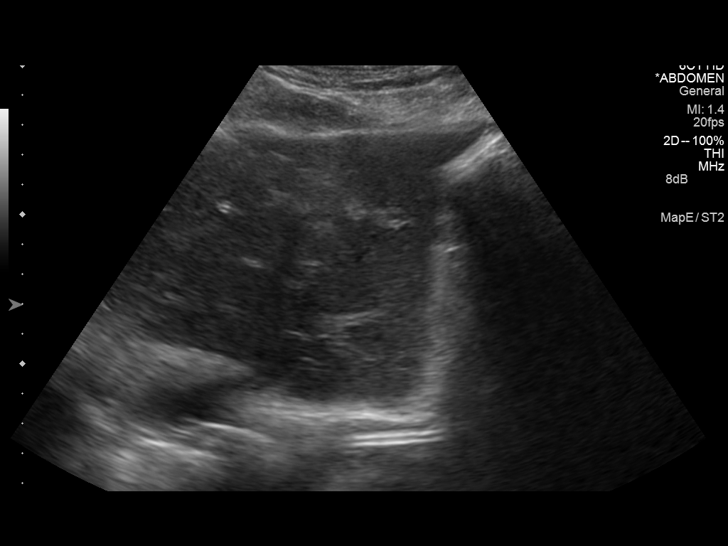
[im 19/41]
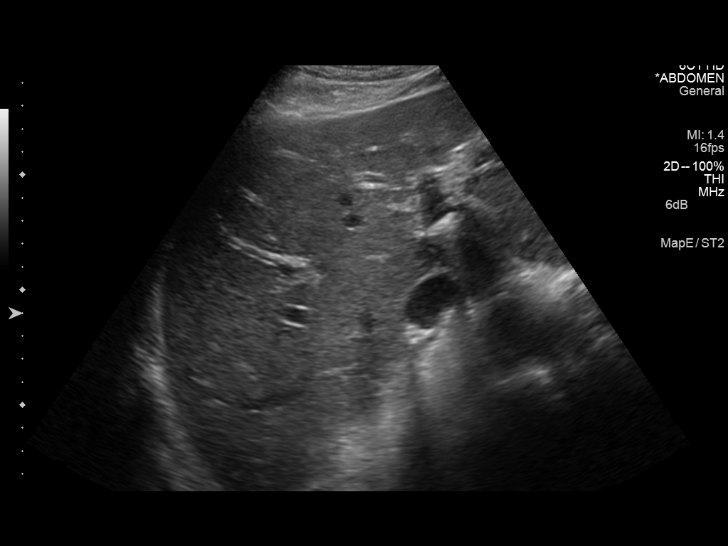
[im 22/41]
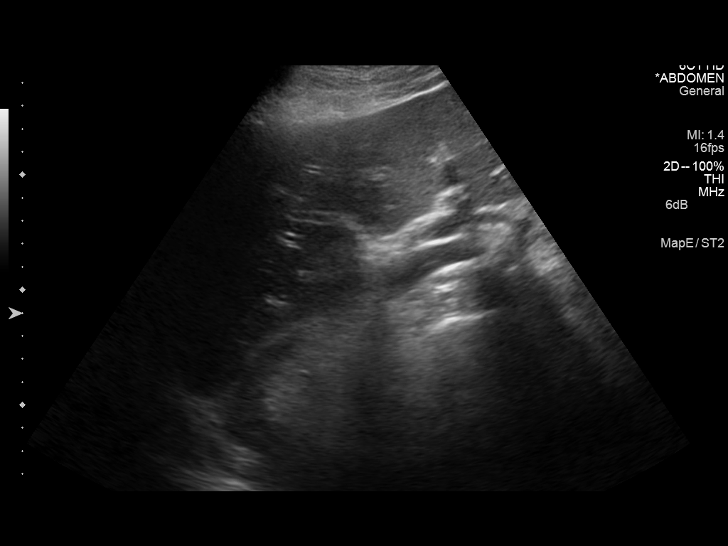
[im 26/41]
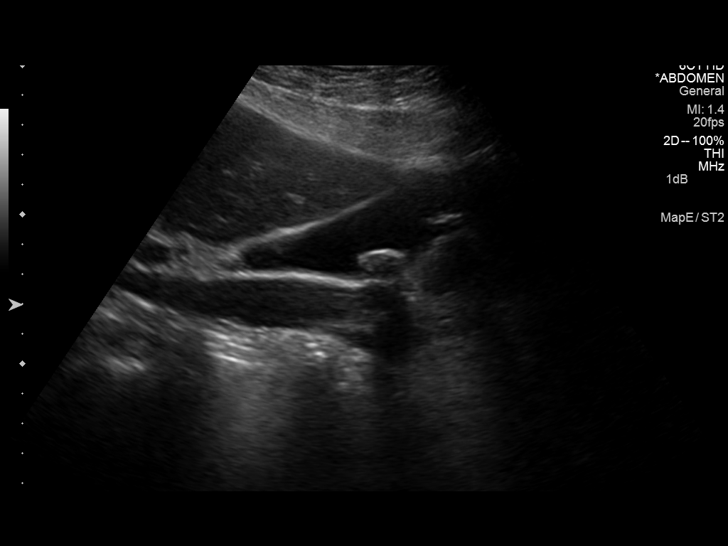
[im 27/41]
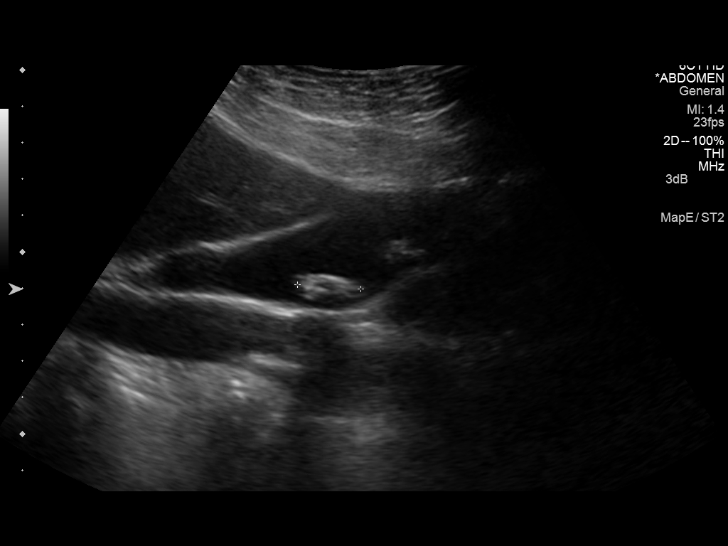
[im 31/41]
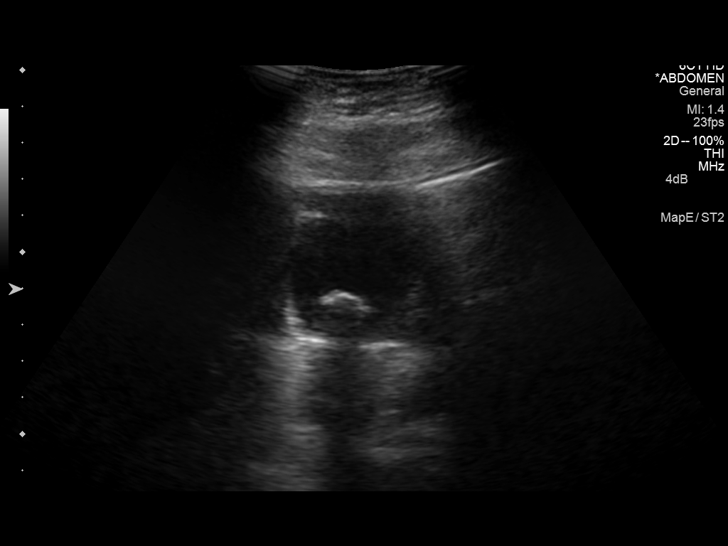
[im 34/41]
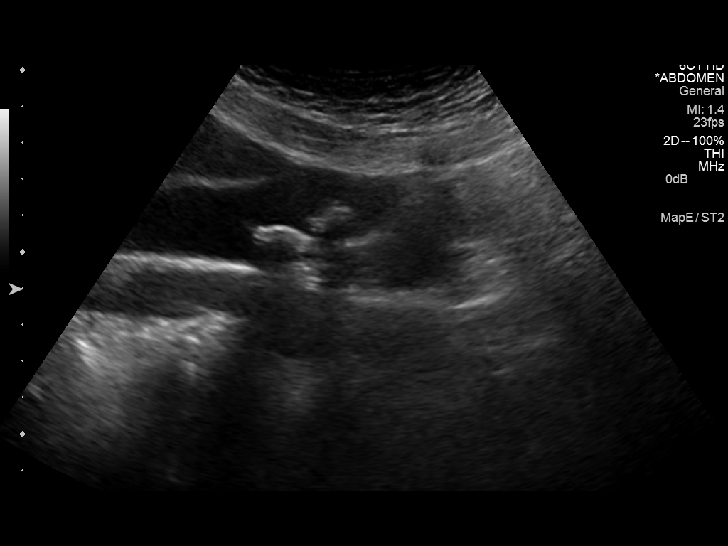
[im 37/41]
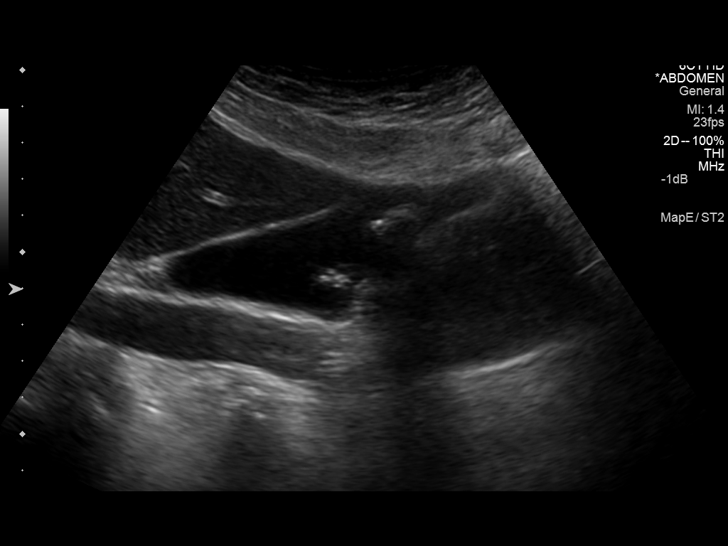
[im 41/41]
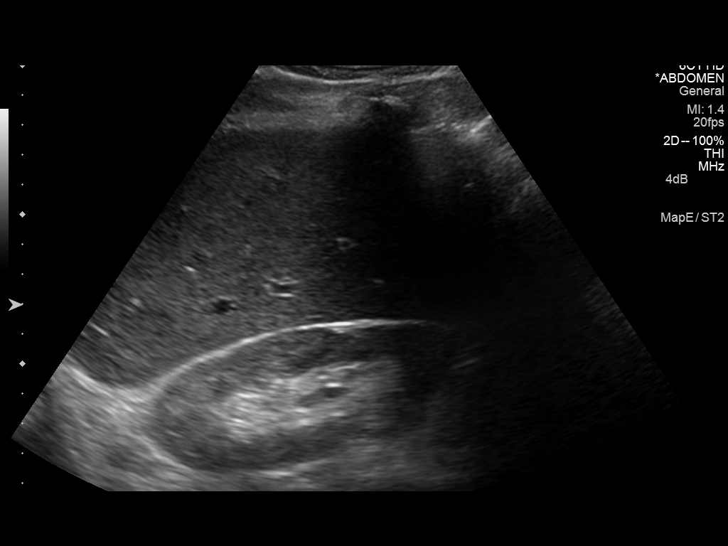

[14 of 25 positions shown; findings below may reference images not displayed]

FINDINGS: Gallbladder:

Echogenic shadowing gallstones noted within the gallbladder. Wall
thickness measures 2 mm. No pericholecystic fluid. Difficult to
assess for a Murphy sign because the patient is ventilated.

Common bile duct:

Diameter: 4.1 mm

Liver:

No focal lesion identified. Within normal limits in parenchymal
echogenicity. Patent portal vein with normal hepatopetal flow.

No free fluid in the upper abdomen. Included views of the right
kidney demonstrate no hydronephrosis. Right pleural effusion
evident.
IMPRESSION: Cholelithiasis.

No other acute finding or biliary dilatation.

Right pleural effusion.
